# Patient Record
Sex: Male | Born: 1950 | Race: White | Hispanic: No | Marital: Married | Smoking: Never smoker
Health system: Southern US, Community
[De-identification: ages and names within clinical notes are randomized; demographics above are authoritative.]

## PROBLEM LIST (undated history)

## (undated) DIAGNOSIS — K219 Gastro-esophageal reflux disease without esophagitis: Secondary | ICD-10-CM

## (undated) HISTORY — DX: Gastro-esophageal reflux disease without esophagitis: K21.9

## (undated) HISTORY — PX: UPPER GASTROINTESTINAL ENDOSCOPY: SHX188

## (undated) HISTORY — PX: COLONOSCOPY: SHX174

---

## 2001-11-11 ENCOUNTER — Ambulatory Visit (HOSPITAL_COMMUNITY): Admission: RE | Admit: 2001-11-11 | Discharge: 2001-11-11 | Payer: Self-pay | Admitting: Internal Medicine

## 2001-11-11 ENCOUNTER — Encounter: Payer: Self-pay | Admitting: Internal Medicine

## 2009-08-09 ENCOUNTER — Encounter: Admission: RE | Admit: 2009-08-09 | Discharge: 2009-08-09 | Payer: Self-pay | Admitting: Internal Medicine

## 2009-09-23 ENCOUNTER — Encounter: Admission: RE | Admit: 2009-09-23 | Discharge: 2009-09-23 | Payer: Self-pay | Admitting: Internal Medicine

## 2012-10-05 ENCOUNTER — Ambulatory Visit (INDEPENDENT_AMBULATORY_CARE_PROVIDER_SITE_OTHER): Payer: BC Managed Care – PPO | Admitting: Internal Medicine

## 2012-10-05 ENCOUNTER — Encounter: Payer: Self-pay | Admitting: Internal Medicine

## 2012-10-05 VITALS — BP 134/68 | HR 53 | Temp 98.0°F | Resp 16 | Ht 70.0 in | Wt 167.6 lb

## 2012-10-05 DIAGNOSIS — Z0000 Encounter for general adult medical examination without abnormal findings: Secondary | ICD-10-CM

## 2012-10-05 LAB — CBC WITH DIFFERENTIAL/PLATELET
Basophils Absolute: 0.1 10*3/uL (ref 0.0–0.1)
Basophils Relative: 1 % (ref 0–1)
Eosinophils Absolute: 0.2 10*3/uL (ref 0.0–0.7)
Eosinophils Relative: 2 % (ref 0–5)
HCT: 45.6 % (ref 39.0–52.0)
Hemoglobin: 15.3 g/dL (ref 13.0–17.0)
Lymphocytes Relative: 23 % (ref 12–46)
Lymphs Abs: 2 10*3/uL (ref 0.7–4.0)
MCH: 28.7 pg (ref 26.0–34.0)
MCHC: 33.6 g/dL (ref 30.0–36.0)
MCV: 85.6 fL (ref 78.0–100.0)
Monocytes Absolute: 0.5 10*3/uL (ref 0.1–1.0)
Monocytes Relative: 5 % (ref 3–12)
Neutro Abs: 6.1 10*3/uL (ref 1.7–7.7)
Neutrophils Relative %: 69 % (ref 43–77)
Platelets: 375 10*3/uL (ref 150–400)
RBC: 5.33 MIL/uL (ref 4.22–5.81)
RDW: 13.9 % (ref 11.5–15.5)
WBC: 8.9 10*3/uL (ref 4.0–10.5)

## 2012-10-05 LAB — POCT URINALYSIS DIPSTICK
Glucose, UA: NEGATIVE
Ketones, UA: NEGATIVE
Leukocytes, UA: NEGATIVE
Nitrite, UA: NEGATIVE
Protein, UA: 30
Spec Grav, UA: >=1.03
Urobilinogen, UA: 0.2
pH, UA: 5

## 2012-10-05 LAB — POCT UA - MICROSCOPIC ONLY
Bacteria, U Microscopic: NEGATIVE
Casts, Ur, LPF, POC: NEGATIVE
Crystals, Ur, HPF, POC: NEGATIVE
Mucus, UA: NEGATIVE
RBC, urine, microscopic: NEGATIVE
Yeast, UA: NEGATIVE

## 2012-10-05 LAB — IFOBT (OCCULT BLOOD): IFOBT: NEGATIVE

## 2012-10-05 NOTE — Progress Notes (Signed)
  Subjective:    Patient ID: Henry Tate, male    DOB: 1951-02-21, 62 y.o.   MRN: [id number]  HPI CPE Continues to do extremely well with no illnesses or medications Headache syndrome of unknown etiology associated with sneezing has gradually improved and is no longer interfering with activity Exercises every day from 5 AM to 6/then meditation and other stretching before work at [employer] Recent reading about codependency-relationship problems unchanged  Family history/social history-unchanged  Review of Systems  Constitutional: Negative for fever, activity change, appetite change, fatigue and unexpected weight change.  HENT: Negative for hearing loss, sneezing, trouble swallowing, neck pain and postnasal drip.   Eyes: Negative for photophobia, discharge and visual disturbance.  Respiratory: Negative for cough, chest tightness and shortness of breath.   Cardiovascular: Negative for chest pain, palpitations and leg swelling.  Gastrointestinal: Negative for abdominal pain, diarrhea, constipation and blood in stool.  Endocrine: Negative for cold intolerance and polyuria.  Genitourinary: Negative for dysuria, urgency, frequency and testicular pain.  Musculoskeletal: Negative for myalgias, back pain, joint swelling and gait problem.       Tendinitis of the wrists and elbows now well controlled by his exercise regimen  Skin: Negative for rash.  Neurological: Negative for weakness and light-headedness.  Hematological: Negative for adenopathy. Does not bruise/bleed easily.  Psychiatric/Behavioral: Negative for sleep disturbance, dysphoric mood and decreased concentration.       Objective:   Physical Exam  Constitutional: He is oriented to person, place, and time. He appears well-developed and well-nourished.  HENT:  Right Ear: External ear normal.  Left Ear: External ear normal.  Nose: Nose normal.  Mouth/Throat: Oropharynx is clear and moist.  Eyes: Conjunctivae  and EOM are normal. Pupils are equal, round, and reactive to light.  Neck: Normal range of motion. Neck supple. No thyromegaly present.  Cardiovascular: Normal rate, regular rhythm, normal heart sounds and intact distal pulses.  Exam reveals no gallop and no friction rub.   No murmur heard. Pulmonary/Chest: Effort normal and breath sounds normal. He has no wheezes.  Abdominal: Soft. Bowel sounds are normal. He exhibits no distension and no mass. There is no tenderness. There is no rebound.  Genitourinary: Rectum normal and prostate normal. Guaiac negative stool.  Musculoskeletal: Normal range of motion. He exhibits no edema.  Lymphadenopathy:    He has no cervical adenopathy.  Neurological: He is alert and oriented to person, place, and time. He has normal reflexes. He displays normal reflexes. No cranial nerve deficit.  Skin: No rash noted.  Psychiatric: He has a normal mood and affect. His behavior is normal. Judgment and thought content normal.          Assessment & Plan:  Annual physical examination  Healthy Routine labs including hepatitis C screening as directed by the [facility]

## 2012-10-06 LAB — COMPREHENSIVE METABOLIC PANEL
ALT: 18 U/L (ref 0–53)
AST: 14 U/L (ref 0–37)
Albumin: 4.5 g/dL (ref 3.5–5.2)
Alkaline Phosphatase: 73 U/L (ref 39–117)
BUN: 22 mg/dL (ref 6–23)
CO2: 28 mEq/L (ref 19–32)
Calcium: 9.4 mg/dL (ref 8.4–10.5)
Chloride: 102 mEq/L (ref 96–112)
Creat: 1.22 mg/dL (ref 0.50–1.35)
Glucose, Bld: 98 mg/dL (ref 70–99)
Potassium: 4.2 mEq/L (ref 3.5–5.3)
Sodium: 138 mEq/L (ref 135–145)
Total Bilirubin: 0.9 mg/dL (ref 0.3–1.2)
Total Protein: 7.3 g/dL (ref 6.0–8.3)

## 2012-10-06 LAB — LIPID PANEL
Cholesterol: 242 mg/dL — ABNORMAL HIGH (ref 0–200)
HDL: 54 mg/dL (ref >39)
LDL Cholesterol: 173 mg/dL — ABNORMAL HIGH (ref 0–99)
Total CHOL/HDL Ratio: 4.5 Ratio
Triglycerides: 75 mg/dL (ref <150)
VLDL: 15 mg/dL (ref 0–40)

## 2012-10-06 LAB — HEPATITIS C ANTIBODY: HCV Ab: NEGATIVE

## 2012-10-06 LAB — PSA: PSA: 0.61 ng/mL (ref <=4.00)

## 2012-10-07 ENCOUNTER — Encounter: Payer: Self-pay | Admitting: Internal Medicine

## 2013-06-02 ENCOUNTER — Telehealth: Payer: Self-pay | Admitting: Radiology

## 2013-06-02 NOTE — Telephone Encounter (Signed)
He has had Tdap, wife advised.

## 2014-04-02 ENCOUNTER — Encounter: Payer: Self-pay | Admitting: Internal Medicine

## 2014-05-25 ENCOUNTER — Ambulatory Visit (AMBULATORY_SURGERY_CENTER): Payer: Self-pay | Admitting: *Deleted

## 2014-05-25 VITALS — Ht 70.5 in | Wt 171.0 lb

## 2014-05-25 DIAGNOSIS — Z1211 Encounter for screening for malignant neoplasm of colon: Secondary | ICD-10-CM

## 2014-05-25 MED ORDER — MOVIPREP 100 G PO SOLR: 1.0000 | Freq: Once | ORAL | Status: DC

## 2014-05-25 NOTE — Progress Notes (Signed)
No egg or soy allergy. No anesthesia problems.  No diet meds  No home O2 

## 2014-06-08 ENCOUNTER — Ambulatory Visit (AMBULATORY_SURGERY_CENTER): Payer: BC Managed Care – PPO | Admitting: Internal Medicine

## 2014-06-08 ENCOUNTER — Encounter: Payer: Self-pay | Admitting: Internal Medicine

## 2014-06-08 VITALS — BP 130/69 | HR 57 | Temp 95.9°F | Resp 27 | Ht 70.5 in | Wt 171.0 lb

## 2014-06-08 DIAGNOSIS — Z1211 Encounter for screening for malignant neoplasm of colon: Secondary | ICD-10-CM

## 2014-06-08 MED ORDER — SODIUM CHLORIDE 0.9 % IV SOLN: 500.0000 mL | INTRAVENOUS | Status: DC

## 2014-06-08 NOTE — Progress Notes (Signed)
No problems noted in the recovery room. maw 

## 2014-06-08 NOTE — Progress Notes (Signed)
Report to PACU, RN, vss, BBS= Clear.  

## 2014-06-08 NOTE — Patient Instructions (Signed)
YOU HAD AN ENDOSCOPIC PROCEDURE TODAY AT THE [facility]: Refer to the procedure report that was given to you for any specific questions about what was found during the examination.  If the procedure report does not answer your questions, please call your gastroenterologist to clarify.  If you requested that your care partner not be given the details of your procedure findings, then the procedure report has been included in a sealed envelope for you to review at your convenience later.  YOU SHOULD EXPECT: Some feelings of bloating in the abdomen. Passage of more gas than usual.  Walking can help get rid of the air that was put into your GI tract during the procedure and reduce the bloating. If you had a lower endoscopy (such as a colonoscopy or flexible sigmoidoscopy) you may notice spotting of blood in your stool or on the toilet paper. If you underwent a bowel prep for your procedure, then you may not have a normal bowel movement for a few days.  DIET: Your first meal following the procedure should be a light meal and then it is ok to progress to your normal diet.  A half-sandwich or bowl of soup is an example of a good first meal.  Heavy or fried foods are harder to digest and may make you feel nauseous or bloated.  Likewise meals heavy in dairy and vegetables can cause extra gas to form and this can also increase the bloating.  Drink plenty of fluids but you should avoid alcoholic beverages for 24 hours.  ACTIVITY: Your care partner should take you home directly after the procedure.  You should plan to take it easy, moving slowly for the rest of the day.  You can resume normal activity the day after the procedure however you should NOT DRIVE or use heavy machinery for 24 hours (because of the sedation medicines used during the test).    SYMPTOMS TO REPORT IMMEDIATELY: A gastroenterologist can be reached at any hour.  During normal business hours, 8:30 AM to 5:00 PM Monday through Friday,  call [phone].  After hours and on weekends, please call the GI answering service at [phone] who will take a message and have the physician on call contact you.   Following lower endoscopy (colonoscopy or flexible sigmoidoscopy):  Excessive amounts of blood in the stool  Significant tenderness or worsening of abdominal pains  Swelling of the abdomen that is new, acute  Fever of 100F or higher  FOLLOW UP: If any biopsies were taken you will be contacted by phone or by letter within the next 1-3 weeks.  Call your gastroenterologist if you have not heard about the biopsies in 3 weeks.  Our staff will call the home number listed on your records the next business day following your procedure to check on you and address any questions or concerns that you may have at that time regarding the information given to you following your procedure. This is a courtesy call and so if there is no answer at the home number and we have not heard from you through the emergency physician on call, we will assume that you have returned to your regular daily activities without incident.  SIGNATURES/CONFIDENTIALITY: You and/or your care partner have signed paperwork which will be entered into your electronic medical record.  These signatures attest to the fact that that the information above on your After Visit Summary has been reviewed and is understood.  Full responsibility of the confidentiality of this   discharge information lies with you and/or your care-partner.     Handouts were given to your care partner on diverticulosis and a high fiber diet with liberal fluid intake. You may resume your current medications today. Please call if any questions or concerns.   

## 2014-06-11 ENCOUNTER — Telehealth: Payer: Self-pay | Admitting: *Deleted

## 2014-06-11 NOTE — Telephone Encounter (Signed)
  Follow up Call-  Call back number 06/08/2014  Post procedure Call Back phone  # [phone]  Permission to leave phone message Yes     Patient questions:  Do you have a fever, pain , or abdominal swelling? No. Pain Score  0 *  Have you tolerated food without any problems? Yes.    Have you been able to return to your normal activities? Yes.    Do you have any questions about your discharge instructions: Diet   No. Medications  No. Follow up visit  No.  Do you have questions or concerns about your Care? No.  Actions: * If pain score is 4 or above: No action needed, pain <4.

## 2014-06-14 NOTE — Op Note (Signed)
[facility]  [address] [place], [id number]   COLONOSCOPY PROCEDURE REPORT  PATIENT: Henry Tate, Henry Tate  MR#: [id number] BIRTHDATE: [birth year] , 63  yrs. old GENDER: male ENDOSCOPIST: Eustace Quail, MD REFERRED GD:[id number] Recall, PROCEDURE DATE:  06/11/2014 PROCEDURE:   Colonoscopy, screening First Screening Colonoscopy - Avg.  risk and is 50 yrs.  old or older - No.  Prior Negative Screening - Now for repeat screening. 10 or more years since last screening  History of Adenoma - Now for follow-up colonoscopy & has been > or = to 3 yrs.  N/A  Polyps Removed Today? No.  Polyps Removed Today? No.  Recommend repeat exam, <10 yrs? No. ASA CLASS:   Class II INDICATIONS:average risk for colorectal cancer.. Negative index exam June 2003 MEDICATIONS: Monitored anesthesia care and Propofol 220 mg IV  DESCRIPTION OF PROCEDURE:   After the risks benefits and alternatives of the procedure were thoroughly explained, informed consent was obtained.  The digital rectal exam revealed no abnormalities of the rectum.   The LB [id number]  endoscope was introduced through the anus and advanced to the cecum, which was identified by both the appendix and ileocecal valve. No adverse events experienced.   The quality of the prep was excellent, using MoviPrep  The instrument was then slowly withdrawn as the colon was fully examined.   NOTE:The endoscopy reporting system was nonfunctional on the day of the examination. No ability for photodocumentation. Report created after the system issues resolved.        COLON FINDINGS: There was moderate diverticulosis noted in the sigmoid colon.   The examination was otherwise normal.  Retroflexed views revealed no abnormalities. The time to cecum=2 minutes 56 seconds.  Withdrawal time=12 minutes 38 seconds.  The scope was withdrawn and the procedure completed. COMPLICATIONS: There were no immediate complications.  ENDOSCOPIC  IMPRESSION: 1.   Moderate diverticulosis was noted in the sigmoid colon 2.   The examination was otherwise normal  RECOMMENDATIONS: 1. Continue current colorectal screening recommendations for "routine risk" patients with a repeat colonoscopy in 10 years.  eSigned:  Eustace Quail, MD 06/14/2014 12:38 PM   cc: The Patient and Tami Lin, MD

## 2015-03-07 ENCOUNTER — Ambulatory Visit (INDEPENDENT_AMBULATORY_CARE_PROVIDER_SITE_OTHER): Payer: BLUE CROSS/BLUE SHIELD | Admitting: Emergency Medicine

## 2015-03-07 VITALS — BP 114/68 | HR 60 | Temp 98.3°F | Resp 20 | Ht 70.0 in | Wt 168.6 lb

## 2015-03-07 DIAGNOSIS — Z23 Encounter for immunization: Secondary | ICD-10-CM | POA: Diagnosis not present

## 2015-03-07 DIAGNOSIS — M79644 Pain in right finger(s): Secondary | ICD-10-CM | POA: Diagnosis not present

## 2015-03-07 DIAGNOSIS — S61219A Laceration without foreign body of unspecified finger without damage to nail, initial encounter: Secondary | ICD-10-CM

## 2015-03-07 DIAGNOSIS — S61210A Laceration without foreign body of right index finger without damage to nail, initial encounter: Secondary | ICD-10-CM

## 2015-03-07 NOTE — Patient Instructions (Signed)
WOUND CARE Please return in 7-10 days to have your stitches/staples removed or sooner if you have concerns. . Keep area clean and dry with dressing in place for 24 hours. . After 24 hours, remove bandage and wash wound gently with mild soap and warm water. When skin is dry, reapply a new bandage. Once wound is no longer draining, may leave wound open to air. . Continue daily cleansing with soap and water until stitches/staples are removed. . Do not apply any ointments or creams to the wound while stitches/staples are in place, as this may cause delayed healing. . Notify the office if you experience any of the following signs of infection: Swelling, redness, pus drainage, streaking, fever >101.0 F . Notify the office if you experience excessive bleeding that does not stop after 15-20 minutes of constant, firm pressure.   

## 2015-03-07 NOTE — Progress Notes (Signed)
Procedure: Verbal consent obtained. Skin was anesthetized with 4 cc 1% lido without epi by metacarpal block and cleaned with soap and water. Laceration was sutured with #5 simple 5.0 ethilon sutures. Wound was dressed and wound care discussed.

## 2015-03-07 NOTE — Progress Notes (Signed)
Subjective:  Patient ID: Henry Tate, male    DOB: 1951-02-23  Age: 64 y.o. MRN: [id number]  CC: Laceration   HPI YECHESKEL KUREK presents  with a laceration of the right second finger while installing a dishwasher. He is not current on tetanus. He has pain in the finger but denies any other complaints had no improvement with over the counter medication. He has injured himself this morning  History Ihan has no past medical history on file.   He has past surgical history that includes Colonoscopy and Upper gastrointestinal endoscopy.   His  family history includes Depression in his mother. There is no history of Colon cancer.  He   reports that he has never smoked. He has never used smokeless tobacco. He reports that he drinks alcohol. He reports that he does not use illicit drugs.  No outpatient prescriptions prior to visit.   No facility-administered medications prior to visit.    Social History   Social History  . Marital Status: Single    Spouse Name: N/A  . Number of Children: N/A  . Years of Education: N/A   Occupational History  . Gen. Contractor    Social History Main Topics  . Smoking status: Never Smoker   . Smokeless tobacco: Never Used  . Alcohol Use: 0.0 oz/week    0 Standard drinks or equivalent per week     Comment: rarely  . Drug Use: No  . Sexual Activity:    Partners: Female   Other Topics Concern  . None   Social History Narrative   Married. Education: [employer]. Exercise: Daily for 45 minutes Cardio and weights.     Review of Systems  Constitutional: Negative for fever, chills and appetite change.  HENT: Negative for congestion, ear pain, postnasal drip, sinus pressure and sore throat.   Eyes: Negative for pain and redness.  Respiratory: Negative for cough, shortness of breath and wheezing.   Cardiovascular: Negative for leg swelling.  Gastrointestinal: Negative for nausea, vomiting, abdominal pain, diarrhea, constipation and  blood in stool.  Endocrine: Negative for polyuria.  Genitourinary: Negative for dysuria, urgency, frequency and flank pain.  Musculoskeletal: Negative for gait problem.  Skin: Negative for rash.  Neurological: Negative for weakness and headaches.  Psychiatric/Behavioral: Negative for confusion and decreased concentration. The patient is not nervous/anxious.     Objective:  BP 114/68 mmHg  Pulse 60  Temp(Src) 98.3 F (36.8 C) (Oral)  Resp 20  Ht 5\' 10"  (1.778 m)  Wt 168 lb 9.6 oz (76.476 kg)  BMI 24.19 kg/m2  SpO2 98%  Physical Exam  Constitutional: He is oriented to person, place, and time. He appears well-developed and well-nourished.  HENT:  Head: Normocephalic and atraumatic.  Eyes: Conjunctivae are normal. Pupils are equal, round, and reactive to light.  Pulmonary/Chest: Effort normal.  Musculoskeletal: He exhibits no edema.  Neurological: He is alert and oriented to person, place, and time.  Skin: Skin is dry.  Psychiatric: He has a normal mood and affect. His behavior is normal. Thought content normal.   Is a 1.5 cm 3 corner laceration of the flexor surface of the terminal phalanx of the right second finger no neurovascular tendon injury was identified is no foreign body   Assessment & Plan:   Kristof was seen today for laceration.  Diagnoses and all orders for this visit:  Need for Tdap vaccination -     Tdap vaccine greater than or equal to 7yo IM   Mr. Grandville Silos  does not currently have medications on file.  No orders of the defined types were placed in this encounter.    Appropriate red flag conditions were discussed with the patient as well as actions that should be taken.  Patient expressed his understanding.  Follow-up: Return in about 1 day (around 03/08/2015).  Roselee Culver, MD

## 2015-06-03 ENCOUNTER — Encounter: Payer: Self-pay | Admitting: Internal Medicine

## 2015-07-08 ENCOUNTER — Ambulatory Visit (INDEPENDENT_AMBULATORY_CARE_PROVIDER_SITE_OTHER): Payer: BLUE CROSS/BLUE SHIELD | Admitting: Emergency Medicine

## 2015-07-08 VITALS — BP 118/80 | HR 89 | Temp 98.9°F | Resp 18 | Ht 70.0 in | Wt 170.6 lb

## 2015-07-08 DIAGNOSIS — J0140 Acute pansinusitis, unspecified: Secondary | ICD-10-CM | POA: Diagnosis not present

## 2015-07-08 DIAGNOSIS — J209 Acute bronchitis, unspecified: Secondary | ICD-10-CM

## 2015-07-08 MED ORDER — HYDROCOD POLST-CPM POLST ER 10-8 MG/5ML PO SUER: 5.0000 mL | Freq: Two times a day (BID) | ORAL | Status: DC

## 2015-07-08 MED ORDER — PSEUDOEPHEDRINE-GUAIFENESIN ER 60-600 MG PO TB12
1.0000 | ORAL_TABLET | Freq: Two times a day (BID) | ORAL | Status: DC
Start: 2015-07-08 — End: 2015-07-31

## 2015-07-08 MED ORDER — AMOXICILLIN-POT CLAVULANATE 875-125 MG PO TABS: 1.0000 | ORAL_TABLET | Freq: Two times a day (BID) | ORAL | Status: DC

## 2015-07-08 NOTE — Patient Instructions (Signed)

## 2015-07-08 NOTE — Progress Notes (Signed)
Subjective:  Patient ID: Henry Tate, male    DOB: April 17, 1951  Age: 65 y.o. MRN: [id number]  CC: URI   HPI KHIAN DEBO presents   Patient has nasal congestion postnasal rainage with a purulent nasal discharge. Shortness cheeks. He has a cough productive of purulent sputum. Associated with wheezing and shortness of breath with exertion. He has no nausea vomiting or stool change. No rash. No improvement with over-the-counter medication.  History Alston has no past medical history on file.   He has past surgical history that includes Colonoscopy and Upper gastrointestinal endoscopy.   His  family history includes Depression in his mother. There is no history of Colon cancer.  He   reports that he has never smoked. He has never used smokeless tobacco. He reports that he drinks alcohol. He reports that he does not use illicit drugs.  No outpatient prescriptions prior to visit.   No facility-administered medications prior to visit.    Social History   Social History  . Marital Status: Single    Spouse Name: N/A  . Number of Children: N/A  . Years of Education: N/A   Occupational History  . Gen. Contractor    Social History Main Topics  . Smoking status: Never Smoker   . Smokeless tobacco: Never Used  . Alcohol Use: 0.0 oz/week    0 Standard drinks or equivalent per week     Comment: rarely  . Drug Use: No  . Sexual Activity:    Partners: Female   Other Topics Concern  . None   Social History Narrative   Married. Education: [employer]. Exercise: Daily for 45 minutes Cardio and weights.     Review of Systems  Constitutional: Negative for fever, chills and appetite change.  HENT: Positive for congestion, postnasal drip, rhinorrhea and sinus pressure. Negative for ear pain and sore throat.   Eyes: Negative for pain and redness.  Respiratory: Positive for cough, shortness of breath and wheezing.   Cardiovascular: Negative for leg swelling.    Gastrointestinal: Negative for nausea, vomiting, abdominal pain, diarrhea, constipation and blood in stool.  Endocrine: Negative for polyuria.  Genitourinary: Negative for dysuria, urgency, frequency and flank pain.  Musculoskeletal: Negative for gait problem.  Skin: Negative for rash.  Neurological: Negative for weakness and headaches.  Psychiatric/Behavioral: Negative for confusion and decreased concentration. The patient is not nervous/anxious.     Objective:  BP 118/80 mmHg  Pulse 89  Temp(Src) 98.9 F (37.2 C) (Oral)  Resp 18  Ht 5\' 10"  (1.778 m)  Wt 170 lb 9.6 oz (77.384 kg)  BMI 24.48 kg/m2  SpO2 97%  Physical Exam  Constitutional: He is oriented to person, place, and time. He appears well-developed and well-nourished. No distress.  HENT:  Head: Normocephalic and atraumatic.  Right Ear: External ear normal.  Left Ear: External ear normal.  Nose: Nose normal.  Eyes: Conjunctivae and EOM are normal. Pupils are equal, round, and reactive to light. No scleral icterus.  Neck: Normal range of motion. Neck supple. No tracheal deviation present.  Cardiovascular: Normal rate, regular rhythm and normal heart sounds.   Pulmonary/Chest: Effort normal. No respiratory distress. He has no wheezes. He has no rales.  Abdominal: He exhibits no mass. There is no tenderness. There is no rebound and no guarding.  Musculoskeletal: He exhibits no edema.  Lymphadenopathy:    He has no cervical adenopathy.  Neurological: He is alert and oriented to person, place, and time. Coordination normal.  Skin: Skin  is warm and dry. No rash noted.  Psychiatric: He has a normal mood and affect. His behavior is normal.      Assessment & Plan:   Tyreq was seen today for uri.  Diagnoses and all orders for this visit:  Acute bronchitis, unspecified organism  Acute pansinusitis, recurrence not specified  Other orders -     amoxicillin-clavulanate (AUGMENTIN) 875-125 MG tablet; Take 1 tablet by  mouth 2 (two) times daily. -     pseudoephedrine-guaifenesin (MUCINEX D) 60-600 MG 12 hr tablet; Take 1 tablet by mouth every 12 (twelve) hours. -     chlorpheniramine-HYDROcodone (TUSSIONEX PENNKINETIC ER) 10-8 MG/5ML SUER; Take 5 mLs by mouth 2 (two) times daily.  I am having Mr. Czerwinski start on amoxicillin-clavulanate, pseudoephedrine-guaifenesin, and chlorpheniramine-HYDROcodone.  Meds ordered this encounter  Medications  . amoxicillin-clavulanate (AUGMENTIN) 875-125 MG tablet    Sig: Take 1 tablet by mouth 2 (two) times daily.    Dispense:  20 tablet    Refill:  0  . pseudoephedrine-guaifenesin (MUCINEX D) 60-600 MG 12 hr tablet    Sig: Take 1 tablet by mouth every 12 (twelve) hours.    Dispense:  18 tablet    Refill:  0  . chlorpheniramine-HYDROcodone (TUSSIONEX PENNKINETIC ER) 10-8 MG/5ML SUER    Sig: Take 5 mLs by mouth 2 (two) times daily.    Dispense:  60 mL    Refill:  0    Appropriate red flag conditions were discussed with the patient as well as actions that should be taken.  Patient expressed his understanding.  Follow-up: Return if symptoms worsen or fail to improve.  Roselee Culver, MD

## 2015-07-17 ENCOUNTER — Ambulatory Visit (INDEPENDENT_AMBULATORY_CARE_PROVIDER_SITE_OTHER): Payer: BLUE CROSS/BLUE SHIELD | Admitting: Internal Medicine

## 2015-07-17 ENCOUNTER — Encounter: Payer: Self-pay | Admitting: Internal Medicine

## 2015-07-17 VITALS — BP 158/75 | HR 51 | Temp 97.9°F | Resp 16 | Ht 70.5 in | Wt 167.0 lb

## 2015-07-17 DIAGNOSIS — Z23 Encounter for immunization: Secondary | ICD-10-CM

## 2015-07-17 DIAGNOSIS — E7800 Pure hypercholesterolemia, unspecified: Secondary | ICD-10-CM

## 2015-07-17 DIAGNOSIS — Z0000 Encounter for general adult medical examination without abnormal findings: Secondary | ICD-10-CM | POA: Diagnosis not present

## 2015-07-17 MED ORDER — ZOSTER VACCINE LIVE 19400 UNT/0.65ML ~~LOC~~ SOLR: 0.6500 mL | Freq: Once | SUBCUTANEOUS | Status: DC

## 2015-07-17 NOTE — Progress Notes (Signed)
   Subjective:    Patient ID: Henry Tate, male    DOB: 09-Dec-1950, 65 y.o.   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 He appears well-developed and well-nourished.  HENT:  Head: Normocephalic and atraumatic.  Right Ear: Hearing, tympanic membrane, external ear and ear canal normal.  Left Ear: Hearing, tympanic membrane, external ear and ear canal normal.  Nose: Nose normal.  Mouth/Throat: Uvula is midline, oropharynx is clear and moist and mucous membranes are normal.  Eyes: Conjunctivae, EOM and lids are normal. Pupils are equal, round, and reactive to light. Right eye exhibits no discharge. Left eye exhibits no discharge. No scleral icterus.  Neck: Trachea normal and normal range of motion. Neck supple. Carotid bruit is not present.  Cardiovascular: Normal rate, regular rhythm, normal heart sounds, intact distal pulses and normal pulses.   No murmur heard. Pulmonary/Chest: Effort normal and breath sounds normal. No respiratory distress. He has no wheezes. He has no rhonchi. He has no rales.  Abdominal: Soft. Normal appearance and bowel sounds are normal. He exhibits no abdominal bruit. There is no tenderness.  Musculoskeletal: Normal range of motion. He exhibits no edema or tenderness.  Lymphadenopathy:       Head (right side): No submental, no submandibular, no tonsillar, no preauricular, no posterior auricular and no occipital adenopathy present.       Head (left side): No submental, no submandibular, no  tonsillar, no preauricular, no posterior auricular and no occipital adenopathy present.    He has no cervical adenopathy.  Neurological: He is alert and oriented to person, place, and time. He has normal strength and normal reflexes. No cranial nerve deficit or sensory deficit. Coordination and gait normal.  Skin: Skin is warm, dry and intact. No lesion and no rash noted.  L shin with 2 1cm oval lesions with erythem surfaces and hypervascularity at edges but no unusual pigmentation ot asymmetrical features  Psychiatric: He has a normal mood and affect. His speech is normal and behavior is normal. Judgment and thought content normal.  BP 158/75 mmHg  Pulse 51  Temp(Src) 97.9 F (36.6 C)  Resp 16  Ht 5' 10.5" (1.791 m)  Wt 167 lb (75.751 kg)  BMI 23.62 kg/m2  No htn in past--he's on decongestants//118/80 at last ov        Assessment & Plan:  Annual physical exam  Need for shingles vaccine - Plan: zoster vaccine live, PF, (ZOSTAVAX) 29562 UNT/0.65ML injection  Elevated cholesterol---recheck--no risk factors other than age and he's against meds for primary prevention  Elevated BP--he'll do outside BPs  Skin lesions-if not resolved w/ topicals 2-3 w we will schedule bx by derm  routin labs-notify results

## 2015-07-31 ENCOUNTER — Other Ambulatory Visit: Payer: Self-pay | Admitting: Family Medicine

## 2015-07-31 ENCOUNTER — Ambulatory Visit (INDEPENDENT_AMBULATORY_CARE_PROVIDER_SITE_OTHER): Payer: BLUE CROSS/BLUE SHIELD | Admitting: Family Medicine

## 2015-07-31 ENCOUNTER — Ambulatory Visit (INDEPENDENT_AMBULATORY_CARE_PROVIDER_SITE_OTHER): Payer: BLUE CROSS/BLUE SHIELD

## 2015-07-31 ENCOUNTER — Ambulatory Visit
Admission: RE | Admit: 2015-07-31 | Discharge: 2015-07-31 | Disposition: A | Discharge status: Home / Self Care | Payer: BLUE CROSS/BLUE SHIELD | Source: Ambulatory Visit | Attending: Family Medicine | Admitting: Family Medicine

## 2015-07-31 VITALS — BP 140/70 | HR 66 | Temp 97.8°F | Resp 20 | Ht 71.0 in | Wt 171.4 lb

## 2015-07-31 DIAGNOSIS — D72829 Elevated white blood cell count, unspecified: Secondary | ICD-10-CM

## 2015-07-31 DIAGNOSIS — N200 Calculus of kidney: Secondary | ICD-10-CM

## 2015-07-31 DIAGNOSIS — R319 Hematuria, unspecified: Secondary | ICD-10-CM

## 2015-07-31 DIAGNOSIS — R1030 Lower abdominal pain, unspecified: Secondary | ICD-10-CM

## 2015-07-31 LAB — POCT CBC
Granulocyte percent: 87 %G — AB (ref 37–80)
HCT, POC: 44.4 % (ref 43.5–53.7)
Hemoglobin: 14.9 g/dL (ref 14.1–18.1)
Lymph, poc: 1.2 (ref 0.6–3.4)
MCH, POC: 29.3 pg (ref 27–31.2)
MCHC: 33.6 g/dL (ref 31.8–35.4)
MCV: 87 fL (ref 80–97)
MID (cbc): 0.7 (ref 0–0.9)
MPV: 7.2 fL (ref 0–99.8)
POC Granulocyte: 12.4 — AB (ref 2–6.9)
POC LYMPH PERCENT: 8.2 %L — AB (ref 10–50)
POC MID %: 4.8 %M (ref 0–12)
Platelet Count, POC: 326 10*3/uL (ref 142–424)
RBC: 5.1 M/uL (ref 4.69–6.13)
RDW, POC: 14.3 %
WBC: 14.3 10*3/uL — AB (ref 4.6–10.2)

## 2015-07-31 LAB — POCT URINALYSIS DIP (MANUAL ENTRY)
Bilirubin, UA: NEGATIVE
Glucose, UA: NEGATIVE
Ketones, POC UA: NEGATIVE
Nitrite, UA: NEGATIVE
Protein Ur, POC: 30 — AB
Spec Grav, UA: 1.025
Urobilinogen, UA: 0.2
pH, UA: 5

## 2015-07-31 LAB — POC MICROSCOPIC URINALYSIS (UMFC): Mucus: ABSENT

## 2015-07-31 MED ORDER — OXYCODONE-ACETAMINOPHEN 5-325 MG PO TABS: 1.0000 | ORAL_TABLET | Freq: Three times a day (TID) | ORAL | Status: DC | PRN

## 2015-07-31 MED ORDER — TAMSULOSIN HCL 0.4 MG PO CAPS: 0.4000 mg | ORAL_CAPSULE | Freq: Every day | ORAL | Status: DC

## 2015-07-31 MED ORDER — POLYETHYLENE GLYCOL 3350 17 GM/SCOOP PO POWD: 17.0000 g | Freq: Two times a day (BID) | ORAL | Status: DC | PRN

## 2015-07-31 NOTE — Progress Notes (Signed)
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  He has been quite constipated lately.  He denies dysuria or hematuria. He's had no fever. He has a family history of diverticulitis but he's never had this himself.  Objective:BP 140/70 mmHg  Pulse 66  Temp(Src) 97.8 F (36.6 C) (Oral)  Resp 20  Ht 5\' 11"  (1.803 m)  Wt 171 lb 6.4 oz (77.747 kg)  BMI 23.92 kg/m2  SpO2 98% HEENT: Moist mucous membranes, no acute distress Chest: Clear Heart: Regular, 1/6 systolic flow murmur Abdomen: Soft minimally, minimally tender, no masses, no HSM, no rebound or guarding Skin: Dry and warm without rash Extremities: No edema, moving normally  [facility] reading (PRIMARY) by  Dr. Ryan Art:  Heavy stool burden in the colon.  Results for orders placed or performed in visit on 07/31/15  POCT CBC  Result Value Ref Range   WBC 14.3 (A) 4.6 - 10.2 K/uL   Lymph, poc 1.2 0.6 - 3.4   POC LYMPH PERCENT 8.2 (A) 10 - 50 %L   MID (cbc) 0.7 0 - 0.9   POC MID % 4.8 0 - 12 %M   POC Granulocyte 12.4 (A) 2 - 6.9   Granulocyte percent 87.0 (A) 37 - 80 %G   RBC 5.10 4.69 - 6.13 M/uL   Hemoglobin 14.9 14.1 - 18.1 g/dL   HCT, POC 44.4 43.5 - 53.7 %   MCV 87.0 80 - 97 fL   MCH, POC 29.3 27 - 31.2 pg   MCHC 33.6 31.8 - 35.4 g/dL   RDW, POC 14.3 %   Platelet Count, POC 326 142 - 424 K/uL   MPV 7.2 0 - 99.8 fL  POCT urinalysis dipstick  Result Value Ref Range   Color, UA yellow yellow   Clarity, UA clear clear   Glucose, UA negative negative   Bilirubin, UA negative negative   Ketones, POC UA negative negative   Spec Grav, UA 1.025    Blood, UA moderate (A) negative   pH, UA 5.0    Protein Ur, POC =30 (A) negative   Urobilinogen, UA 0.2    Nitrite, UA Negative Negative   Leukocytes, UA Trace (A) Negative  POCT Microscopic Urinalysis ([facility])  Result Value Ref Range   WBC,UR,HPF,POC  None None WBC/hpf   RBC,UR,HPF,POC None None RBC/hpf   Bacteria None None, Too numerous to count   Mucus Absent Absent   Epithelial Cells, UR Per Microscopy Few (A) None, Too numerous to count cells/hpf   Assessment: Acute abdominal pain with hematuria and elevated white count.  Plan: CT with contrast of the abdomen and pelvis today  Signed, Robyn Haber M.D.   Henry Tate, Henry Tate, 65 y.o., Apr 21, 1951 Last Weight:  171 lb 6.4 oz (77.747 kg) Phone:  [phone] PCP:  Leandrew Koyanagi Language:  English Need Interp:  No AllergiesNo Known Allergies Health Maintenance:  Due FYIGeneral Primary Ins:  208 MRN:  [id number] MyChart:  Declined Next Appt:  None   CT Abdomen Pelvis Wo Contrast  Status: Finalresult Visible to patient:  Not Released Dx:  Leukocytosis; Lower abdominal pain; H...        Details     Reading Physician Reading Date Result Priority    Lavonia Dana, MD 07/31/2015          Narrative        CLINICAL DATA: Lower abdominal  pain and microscopic hematuria for 2 days, leukocytosis, question kidney stone  EXAM: CT ABDOMEN AND PELVIS WITHOUT CONTRAST  TECHNIQUE: Multidetector CT imaging of the abdomen and pelvis was performed following the standard protocol without IV contrast. Sagittal and coronal MPR images reconstructed from axial data set. IV contrast was not administered due to renal dysfunction bike testing at time of imaging.  Creatinine was obtained on site at [place] at [address]  Results: Creatinine 2.1 mg/dL.  COMPARISON: None  FINDINGS: Lung bases clear.  Mild LEFT hydronephrosis and hydroureter secondary to a 3 mm calculus at the LEFT ureterovesical junction.  Additional tiny nonobstructing calculi at upper and lower poles LEFT kidney.  Within limits of a nonenhanced exam, no additional focal abnormalities of the liver, spleen, pancreas, kidneys, or adrenal glands  identified.  Multiple tiny ingested radio opacities within stool of the RIGHT colon.  Normal appendix.  Stomach and bowel loops unremarkable for technique.  No mass, adenopathy, free air or free fluid.  Question tiny RIGHT inguinal hernia containing fat.  Degenerative disc disease changes lumbar spine with questionable vertebral hemangioma at L3.  IMPRESSION: Mild LEFT hydronephrosis and hydroureter secondary to a 3 mm LEFT UVJ calculus.  Additional tiny nonobstructing LEFT renal calculi.  Question tiny RIGHT inguinal hernia containing fat.  Renal dysfunction, with creatinine measured at 2.1 mg/dl at time of imaging.   Electronically Signed By: Lavonia Dana M.D. On: 07/31/2015 16:42             Last Resulted: 07/31/15 4:43 PM

## 2015-07-31 NOTE — Patient Instructions (Addendum)
YOU ARE TO GO OVER TO  IMAGING NOW [place].  [id number]  Because you received an x-ray today, you will receive an invoice from [facility]. Please contact [facility] at [phone] with questions or concerns regarding your invoice. Our billing staff will not be able to assist you with those questions.

## 2015-08-08 ENCOUNTER — Encounter: Payer: Self-pay | Admitting: Internal Medicine

## 2015-08-09 ENCOUNTER — Telehealth: Payer: Self-pay | Admitting: Internal Medicine

## 2015-08-09 DIAGNOSIS — Z0000 Encounter for general adult medical examination without abnormal findings: Secondary | ICD-10-CM

## 2015-08-09 NOTE — Telephone Encounter (Signed)
Called and spoke with pt and he stated that he will come next week to the appointment center to have his labs drawn.  Will forward to Dr. Laney Pastor to make him aware so he may enter the future lab orders for this pt.  Thank you.

## 2015-08-10 NOTE — Telephone Encounter (Signed)
Repeat draw as we lost blood at [facility]

## 2015-08-13 ENCOUNTER — Other Ambulatory Visit: Payer: BLUE CROSS/BLUE SHIELD

## 2015-08-13 DIAGNOSIS — Z0000 Encounter for general adult medical examination without abnormal findings: Secondary | ICD-10-CM | POA: Diagnosis not present

## 2015-08-13 LAB — COMPREHENSIVE METABOLIC PANEL
ALK PHOS: 66 U/L (ref 40–115)
ALT: 19 U/L (ref 9–46)
AST: 12 U/L (ref 10–35)
Albumin: 3.7 g/dL (ref 3.6–5.1)
BILIRUBIN TOTAL: 0.5 mg/dL (ref 0.2–1.2)
BUN: 20 mg/dL (ref 7–25)
CO2: 26 mmol/L (ref 20–31)
CREATININE: 1.29 mg/dL — AB (ref 0.70–1.25)
Calcium: 8.8 mg/dL (ref 8.6–10.3)
Chloride: 101 mmol/L (ref 98–110)
GLUCOSE: 103 mg/dL — AB (ref 65–99)
Potassium: 4.4 mmol/L (ref 3.5–5.3)
Sodium: 139 mmol/L (ref 135–146)
TOTAL PROTEIN: 6.7 g/dL (ref 6.1–8.1)

## 2015-08-13 LAB — LIPID PANEL
Cholesterol: 195 mg/dL (ref 125–200)
HDL: 41 mg/dL (ref >=40)
LDL Cholesterol: 137 mg/dL — ABNORMAL HIGH (ref <130)
Total CHOL/HDL Ratio: 4.8 Ratio (ref <=5.0)
Triglycerides: 83 mg/dL (ref <150)
VLDL: 17 mg/dL (ref <30)

## 2015-08-14 ENCOUNTER — Encounter: Payer: Self-pay | Admitting: Internal Medicine

## 2015-08-14 LAB — PSA: PSA: 0.72 ng/mL (ref <=4.00)

## 2016-02-15 ENCOUNTER — Other Ambulatory Visit: Payer: Self-pay | Admitting: Family Medicine

## 2016-02-15 DIAGNOSIS — X503XXA Overexertion from repetitive movements, initial encounter: Secondary | ICD-10-CM

## 2016-02-15 DIAGNOSIS — S161XXA Strain of muscle, fascia and tendon at neck level, initial encounter: Secondary | ICD-10-CM

## 2016-02-18 DIAGNOSIS — M542 Cervicalgia: Secondary | ICD-10-CM | POA: Diagnosis not present

## 2016-02-27 DIAGNOSIS — M542 Cervicalgia: Secondary | ICD-10-CM | POA: Diagnosis not present

## 2016-03-04 DIAGNOSIS — M542 Cervicalgia: Secondary | ICD-10-CM | POA: Diagnosis not present

## 2016-03-06 DIAGNOSIS — M542 Cervicalgia: Secondary | ICD-10-CM | POA: Diagnosis not present

## 2016-03-10 DIAGNOSIS — M542 Cervicalgia: Secondary | ICD-10-CM | POA: Diagnosis not present

## 2016-03-12 DIAGNOSIS — M542 Cervicalgia: Secondary | ICD-10-CM | POA: Diagnosis not present

## 2016-03-16 DIAGNOSIS — M542 Cervicalgia: Secondary | ICD-10-CM | POA: Diagnosis not present

## 2016-03-19 DIAGNOSIS — M542 Cervicalgia: Secondary | ICD-10-CM | POA: Diagnosis not present

## 2016-03-23 DIAGNOSIS — M542 Cervicalgia: Secondary | ICD-10-CM | POA: Diagnosis not present

## 2016-03-26 DIAGNOSIS — M542 Cervicalgia: Secondary | ICD-10-CM | POA: Diagnosis not present

## 2016-03-31 DIAGNOSIS — M542 Cervicalgia: Secondary | ICD-10-CM | POA: Diagnosis not present

## 2016-04-02 DIAGNOSIS — M542 Cervicalgia: Secondary | ICD-10-CM | POA: Diagnosis not present

## 2016-04-06 DIAGNOSIS — M542 Cervicalgia: Secondary | ICD-10-CM | POA: Diagnosis not present

## 2016-04-13 DIAGNOSIS — M542 Cervicalgia: Secondary | ICD-10-CM | POA: Diagnosis not present

## 2016-04-16 DIAGNOSIS — M542 Cervicalgia: Secondary | ICD-10-CM | POA: Diagnosis not present

## 2016-04-21 DIAGNOSIS — M542 Cervicalgia: Secondary | ICD-10-CM | POA: Diagnosis not present

## 2016-04-23 DIAGNOSIS — M542 Cervicalgia: Secondary | ICD-10-CM | POA: Diagnosis not present

## 2016-04-30 DIAGNOSIS — M542 Cervicalgia: Secondary | ICD-10-CM | POA: Diagnosis not present

## 2016-05-04 DIAGNOSIS — M542 Cervicalgia: Secondary | ICD-10-CM | POA: Diagnosis not present

## 2016-05-06 DIAGNOSIS — M542 Cervicalgia: Secondary | ICD-10-CM | POA: Diagnosis not present

## 2016-05-11 DIAGNOSIS — M542 Cervicalgia: Secondary | ICD-10-CM | POA: Diagnosis not present

## 2016-06-15 DIAGNOSIS — H25813 Combined forms of age-related cataract, bilateral: Secondary | ICD-10-CM | POA: Diagnosis not present

## 2016-06-15 DIAGNOSIS — H524 Presbyopia: Secondary | ICD-10-CM | POA: Diagnosis not present

## 2016-06-15 DIAGNOSIS — H02054 Trichiasis without entropian left upper eyelid: Secondary | ICD-10-CM | POA: Diagnosis not present

## 2016-06-15 DIAGNOSIS — D2311 Other benign neoplasm of skin of right eyelid, including canthus: Secondary | ICD-10-CM | POA: Diagnosis not present

## 2016-08-12 DIAGNOSIS — Z1283 Encounter for screening for malignant neoplasm of skin: Secondary | ICD-10-CM | POA: Diagnosis not present

## 2016-08-12 DIAGNOSIS — L308 Other specified dermatitis: Secondary | ICD-10-CM | POA: Diagnosis not present

## 2016-12-02 ENCOUNTER — Ambulatory Visit (HOSPITAL_COMMUNITY)
Admission: EM | Admit: 2016-12-02 | Discharge: 2016-12-02 | Disposition: A | Discharge status: Home / Self Care | Payer: Medicare Other | Attending: Family Medicine | Admitting: Family Medicine

## 2016-12-02 ENCOUNTER — Encounter (HOSPITAL_COMMUNITY): Payer: Self-pay | Admitting: Emergency Medicine

## 2016-12-02 DIAGNOSIS — M722 Plantar fascial fibromatosis: Secondary | ICD-10-CM

## 2016-12-02 MED ORDER — METHYLPREDNISOLONE ACETATE 80 MG/ML IJ SUSP
INTRAMUSCULAR | Status: AC
  Filled 2016-12-02: qty 1

## 2016-12-02 MED ORDER — BUPIVACAINE HCL (PF) 0.5 % IJ SOLN
INTRAMUSCULAR | Status: AC
  Filled 2016-12-02: qty 10

## 2016-12-02 NOTE — ED Provider Notes (Signed)
[facility]    CSN: [id number] Arrival date & time: 12/02/16  1809     History   Chief Complaint Chief Complaint  Patient presents with  . Foot Pain    HPI Henry Tate is a 66 y.o. male.   The patient presented to the [facility] with a complaint of pain to the right foot and heal for about 5 months.      History reviewed. No pertinent past medical history.  There are no active problems to display for this patient.   Past Surgical History:  Procedure Laterality Date  . COLONOSCOPY    . UPPER GASTROINTESTINAL ENDOSCOPY         Home Medications    Prior to Admission medications   Not on File    Family History Family History  Problem Relation Age of Onset  . Depression Mother   . Cancer Mother        breast cancer  . Mental retardation Mother   . Colon cancer Neg Hx   . Pulmonary embolism Father     Social History Social History  Substance Use Topics  . Smoking status: Never Smoker  . Smokeless tobacco: Never Used  . Alcohol use 0.0 oz/week     Comment: rarely     Allergies   Patient has no known allergies.   Review of Systems Review of Systems  Musculoskeletal: Positive for gait problem.  All other systems reviewed and are negative.    Physical Exam Triage Vital Signs ED Triage Vitals  Enc Vitals Group     BP 12/02/16 1847 (!) 146/64     Pulse Rate 12/02/16 1847 (!) 49     Resp 12/02/16 1847 16     Temp 12/02/16 1847 98.2 F (36.8 C)     Temp Source 12/02/16 1847 Oral     SpO2 12/02/16 1847 100 %     Weight --      Height --      Head Circumference --      Peak Flow --      Pain Score 12/02/16 1846 1     Pain Loc --      Pain Edu? --      Excl. in [place]? --    No data found.   Updated Vital Signs BP (!) 146/64 (BP Location: Right Arm)   Pulse (!) 49 Comment: normal per patient  Temp 98.2 F (36.8 C) (Oral)   Resp 16   SpO2 100%    Physical Exam  Constitutional: He appears well-developed and  well-nourished.  HENT:  Right Ear: External ear normal.  Left Ear: External ear normal.  Eyes: Conjunctivae are normal. Pupils are equal, round, and reactive to light.  Neck: Normal range of motion. Neck supple.  Pulmonary/Chest: Effort normal.  Musculoskeletal: Normal range of motion. He exhibits tenderness.  Tender right calcaneus where the insertion of the plantar fascia occurs.  After Betadine prep, the most tender area was injected with 80 mg a depo Medrol and 0.5% Marcaine.  Patient pain was relieved.  Neurological: He is alert.  Skin: Skin is warm and dry.  Nursing note and vitals reviewed.    UC Treatments / Results  Labs (all labs ordered are listed, but only abnormal results are displayed) Labs Reviewed - No data to display  EKG  EKG Interpretation None       Radiology No results found.  Procedures Procedures (including critical care time)  Medications Ordered in UC Medications -  No data to display   Initial Impression / Assessment and Plan / UC Course  I have reviewed the triage vital signs and the nursing notes.  Pertinent labs & imaging results that were available during my care of the patient were reviewed by me and considered in my medical decision making (see chart for details).     Final Clinical Impressions(s) / UC Diagnoses   Final diagnoses:  Plantar fasciitis of right foot    New Prescriptions New Prescriptions   No medications on file     Robyn Haber, MD 12/02/16 1924

## 2016-12-02 NOTE — ED Triage Notes (Signed)
The patient presented to the [facility] with a complaint of pain to the right foot and heal for about 5 months.

## 2017-04-14 ENCOUNTER — Encounter: Payer: Self-pay | Admitting: Podiatry

## 2017-04-14 ENCOUNTER — Ambulatory Visit (INDEPENDENT_AMBULATORY_CARE_PROVIDER_SITE_OTHER): Payer: Medicare Other

## 2017-04-14 ENCOUNTER — Ambulatory Visit (INDEPENDENT_AMBULATORY_CARE_PROVIDER_SITE_OTHER): Payer: Medicare Other | Admitting: Podiatry

## 2017-04-14 VITALS — BP 129/72 | HR 54 | Resp 16

## 2017-04-14 DIAGNOSIS — M722 Plantar fascial fibromatosis: Secondary | ICD-10-CM | POA: Diagnosis not present

## 2017-04-14 MED ORDER — TRIAMCINOLONE ACETONIDE 10 MG/ML IJ SUSP
10.0000 mg | Freq: Once | INTRAMUSCULAR | Status: AC
  Administered 2017-04-14: 10 mg

## 2017-04-14 MED ORDER — DICLOFENAC SODIUM 75 MG PO TBEC: 75.0000 mg | DELAYED_RELEASE_TABLET | Freq: Two times a day (BID) | ORAL | 2 refills | Status: DC

## 2017-04-14 NOTE — Progress Notes (Signed)
Subjective:    Patient ID: Henry Tate, male   DOB: 66 y.o.   MRN: [id number]   HPI patient presents with approximate 6 month history of heel pain right. States he had 1 previous cortisone injection which only helped him temporarily and the pain has been quite intense with palpation and worse after periods of ambulation patient is not a smoker and likes to be active but this is reducing his activity    Review of Systems  All other systems reviewed and are negative.       Objective:  Physical Exam  Constitutional: He appears well-developed and well-nourished.  Cardiovascular: Intact distal pulses.   Pulmonary/Chest: Effort normal.  Musculoskeletal: Normal range of motion.  Neurological: He is alert.  Skin: Skin is warm.  Nursing note and vitals reviewed.  neurovascular status intact muscle strength adequate range of motion within normal limits with patient found to have exquisite discomfort plantar aspect right heel at the insertional point of the tendon into the calcaneus with inflammation fluid around the medial band. Patient's found have good digital perfusion and is well oriented 3     Assessment:   Acute plantar fasciitis right with inflammation fluid around the medial band      Plan:    H&P condition reviewed and injected the fascia 3 mg Kenalog 5 mill grams Xylocaine and applied fascial brace gave instructions on physical therapy supportive therapy and placed on diclofenac 75 mg twice a day. Reappoint 3 weeks and may require orthotics  X-rays was negative for signs of fracture at this time with no indications for significant spur formation

## 2017-04-14 NOTE — Progress Notes (Signed)
   Subjective:    Patient ID: Henry Tate, male    DOB: 12-08-1950, 66 y.o.   MRN: [id number]  HPI    Review of Systems  All other systems reviewed and are negative.      Objective:   Physical Exam        Assessment & Plan:

## 2017-04-14 NOTE — Patient Instructions (Signed)

## 2017-05-05 ENCOUNTER — Ambulatory Visit (INDEPENDENT_AMBULATORY_CARE_PROVIDER_SITE_OTHER): Payer: Medicare Other | Admitting: Podiatry

## 2017-05-05 ENCOUNTER — Encounter: Payer: Self-pay | Admitting: Podiatry

## 2017-05-05 DIAGNOSIS — M722 Plantar fascial fibromatosis: Secondary | ICD-10-CM | POA: Diagnosis not present

## 2017-05-05 MED ORDER — TRIAMCINOLONE ACETONIDE 10 MG/ML IJ SUSP
10.0000 mg | Freq: Once | INTRAMUSCULAR | Status: AC
  Administered 2017-05-05: 10 mg

## 2017-05-05 NOTE — Progress Notes (Signed)
Subjective:    Patient ID: Henry Tate, male   DOB: 66 y.o.   MRN: [id number]   HPI patient states feeling a lot better with minimal discomfort    ROS      Objective:  Physical Exam neurovascular status intact with discomfort still present but improved     Assessment:    Fasciitis resident but improving     Plan:    Advised on anti-inflammatories physical therapy and supportive shoes and reappoint to recheck

## 2017-06-18 DIAGNOSIS — H25813 Combined forms of age-related cataract, bilateral: Secondary | ICD-10-CM | POA: Diagnosis not present

## 2017-06-18 DIAGNOSIS — H02054 Trichiasis without entropian left upper eyelid: Secondary | ICD-10-CM | POA: Diagnosis not present

## 2017-06-18 DIAGNOSIS — H04123 Dry eye syndrome of bilateral lacrimal glands: Secondary | ICD-10-CM | POA: Diagnosis not present

## 2017-06-18 DIAGNOSIS — H524 Presbyopia: Secondary | ICD-10-CM | POA: Diagnosis not present

## 2017-07-05 DIAGNOSIS — M25512 Pain in left shoulder: Secondary | ICD-10-CM | POA: Diagnosis not present

## 2017-07-08 DIAGNOSIS — M25512 Pain in left shoulder: Secondary | ICD-10-CM | POA: Diagnosis not present

## 2017-07-13 DIAGNOSIS — M25512 Pain in left shoulder: Secondary | ICD-10-CM | POA: Diagnosis not present

## 2017-07-15 DIAGNOSIS — M25512 Pain in left shoulder: Secondary | ICD-10-CM | POA: Diagnosis not present

## 2017-07-19 DIAGNOSIS — M25512 Pain in left shoulder: Secondary | ICD-10-CM | POA: Diagnosis not present

## 2017-07-22 DIAGNOSIS — M25512 Pain in left shoulder: Secondary | ICD-10-CM | POA: Diagnosis not present

## 2017-07-27 DIAGNOSIS — M25512 Pain in left shoulder: Secondary | ICD-10-CM | POA: Diagnosis not present

## 2017-07-29 DIAGNOSIS — M25512 Pain in left shoulder: Secondary | ICD-10-CM | POA: Diagnosis not present

## 2017-08-02 DIAGNOSIS — M25512 Pain in left shoulder: Secondary | ICD-10-CM | POA: Diagnosis not present

## 2017-08-06 DIAGNOSIS — M25512 Pain in left shoulder: Secondary | ICD-10-CM | POA: Diagnosis not present

## 2017-08-11 DIAGNOSIS — M25512 Pain in left shoulder: Secondary | ICD-10-CM | POA: Diagnosis not present

## 2017-08-13 DIAGNOSIS — M25512 Pain in left shoulder: Secondary | ICD-10-CM | POA: Diagnosis not present

## 2017-08-16 DIAGNOSIS — M25512 Pain in left shoulder: Secondary | ICD-10-CM | POA: Diagnosis not present

## 2017-08-19 DIAGNOSIS — M25512 Pain in left shoulder: Secondary | ICD-10-CM | POA: Diagnosis not present

## 2017-09-22 IMAGING — CT CT ABD-PELV W/O CM
2 of 4 series · 15 of 46 positions shown, 17 images · non-contrast
Comparison: None

CLINICAL DATA: Lower abdominal pain and microscopic hematuria for 2
days, leukocytosis, question kidney stone

EXAM:
CT ABDOMEN AND PELVIS WITHOUT CONTRAST
TECHNIQUE: Multidetector CT imaging of the abdomen and pelvis was performed
following the standard protocol without IV contrast. Sagittal and
coronal MPR images reconstructed from axial data set. IV contrast
was not administered due to renal dysfunction bike testing at time
of imaging.
Creatinine was obtained on site at [HOSPITAL] at [HOSPITAL].
Results: Creatinine 2.1 mg/dL.

[Series 2: abd/pelvis w/(date) · axial · 0.66mm/px · z∈[+80,+495]mm · 12 of 99 slices shown, 14 images]
[im 8/99  soft-tissue]
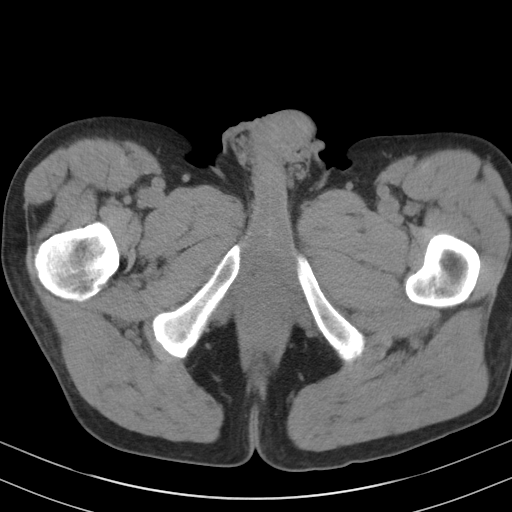
[im 8/99  bone]
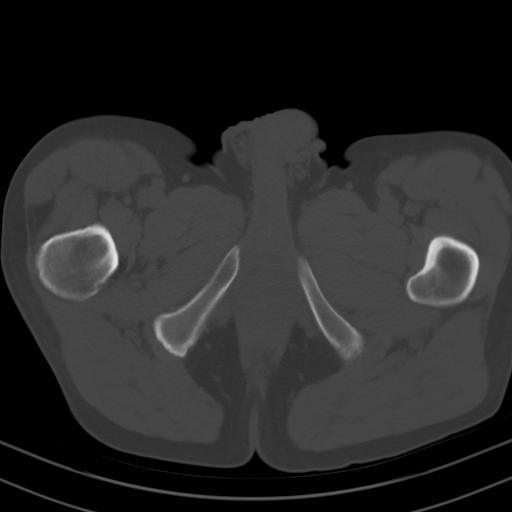
[im 16/99  soft-tissue]
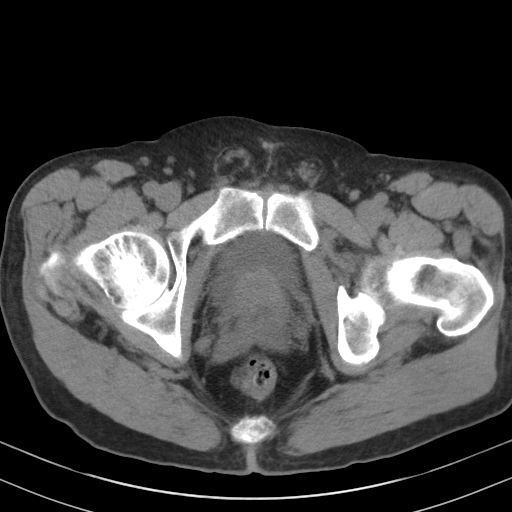
[im 23/99  soft-tissue]
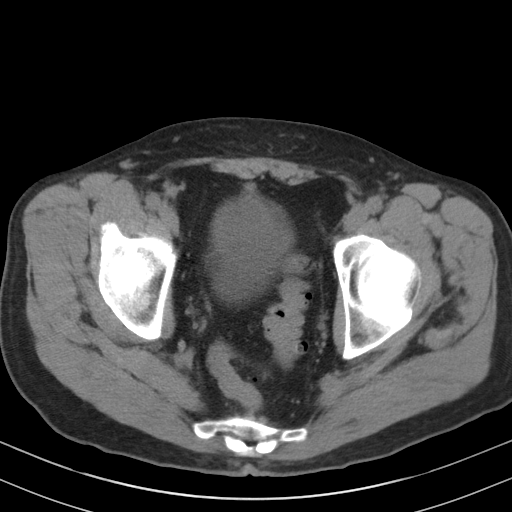
[im 31/99  soft-tissue]
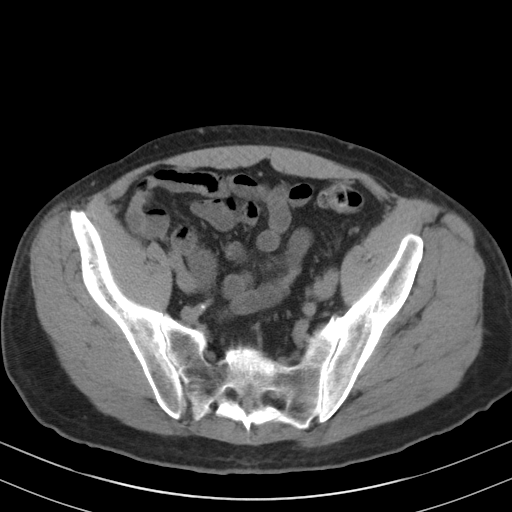
[im 38/99  soft-tissue]
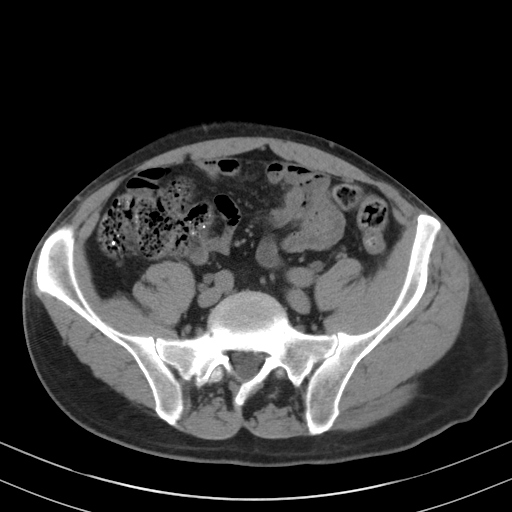
[im 46/99  soft-tissue]
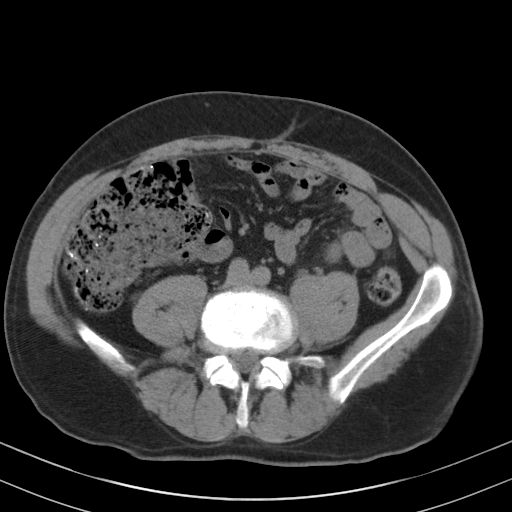
[im 53/99  soft-tissue]
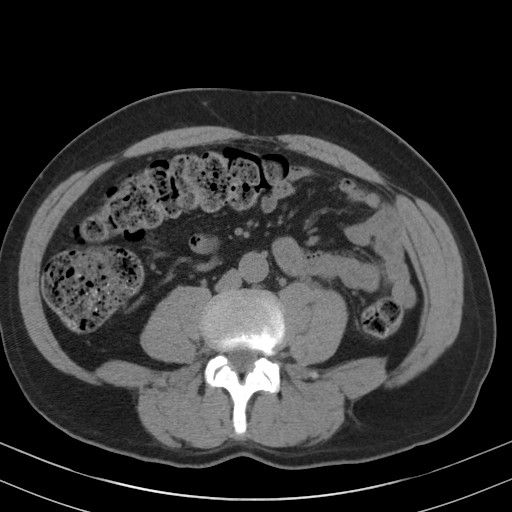
[im 61/99  soft-tissue]
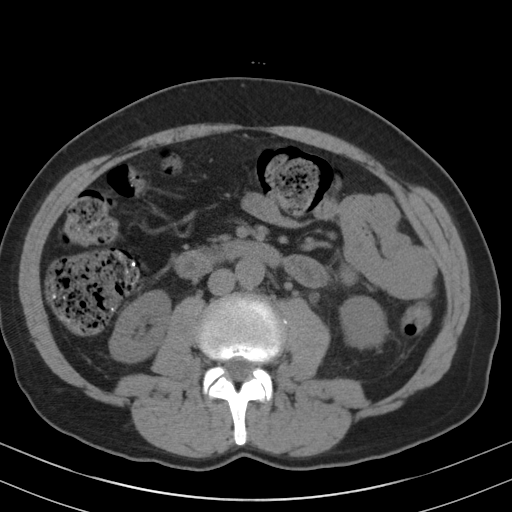
[im 68/99  soft-tissue]
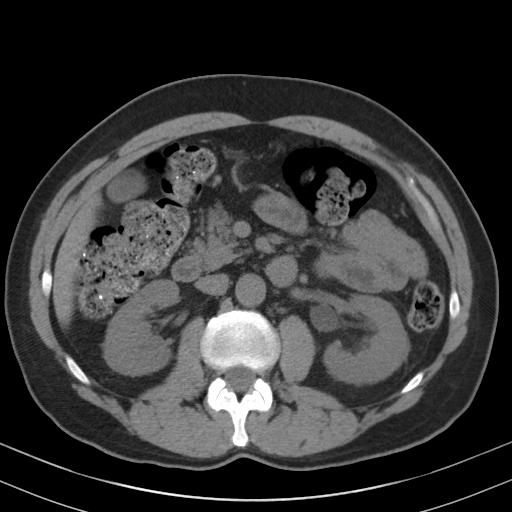
[im 68/99  bone]
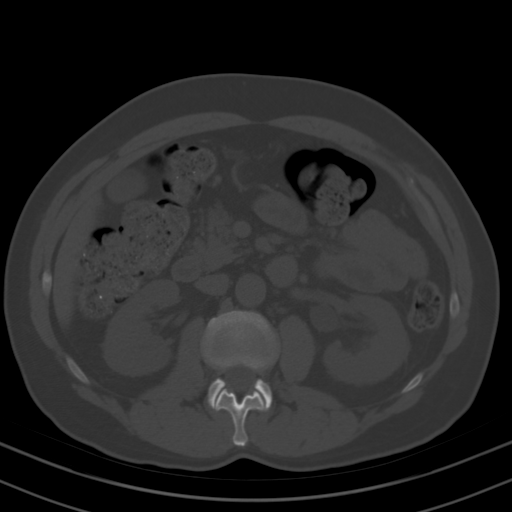
[im 76/99  soft-tissue]
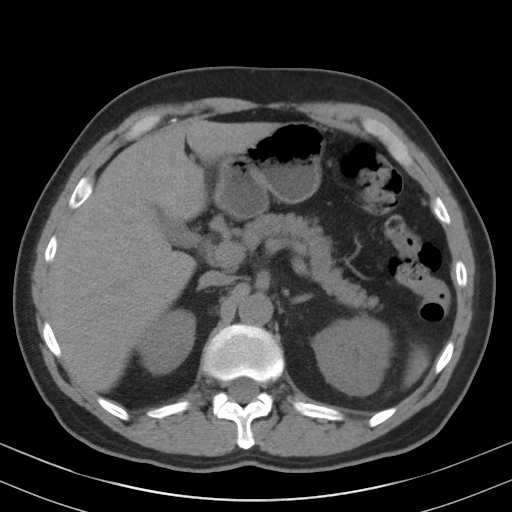
[im 83/99  soft-tissue]
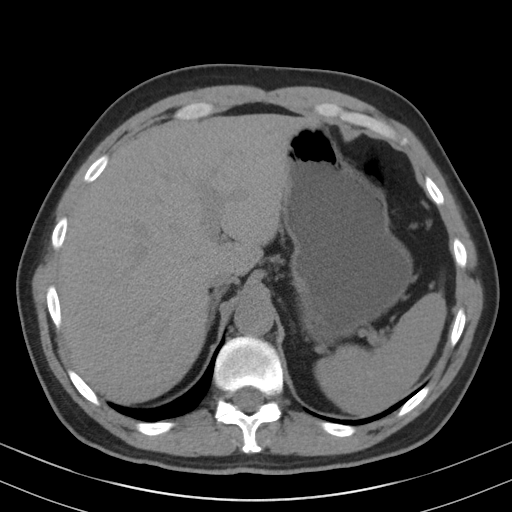
[im 91/99  soft-tissue]
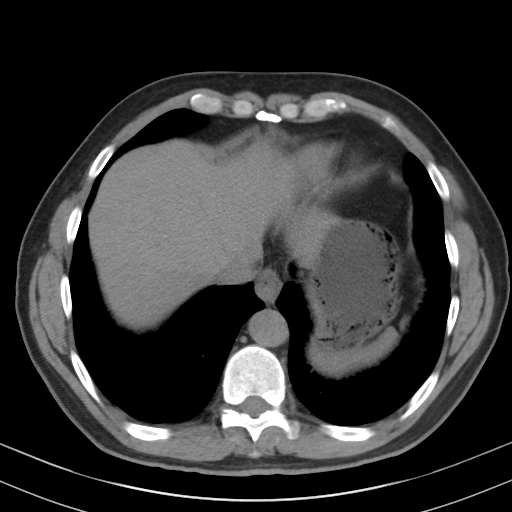

[Series 3: cor · coronal · 0.63mm/px · 3 of 82 slices shown]
[im 28/82  soft-tissue]
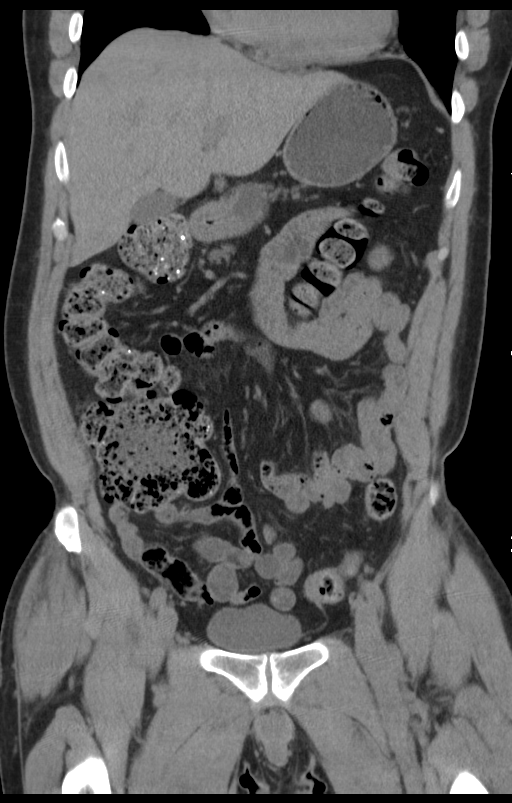
[im 37/82  soft-tissue]
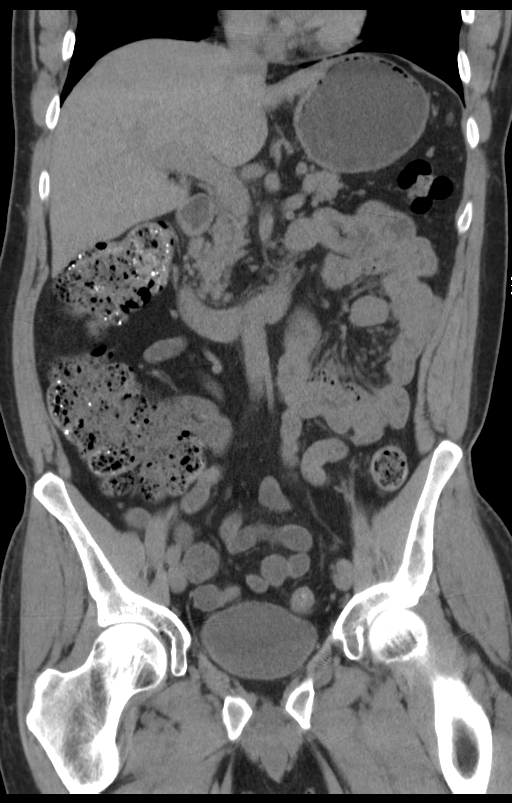
[im 46/82  soft-tissue]
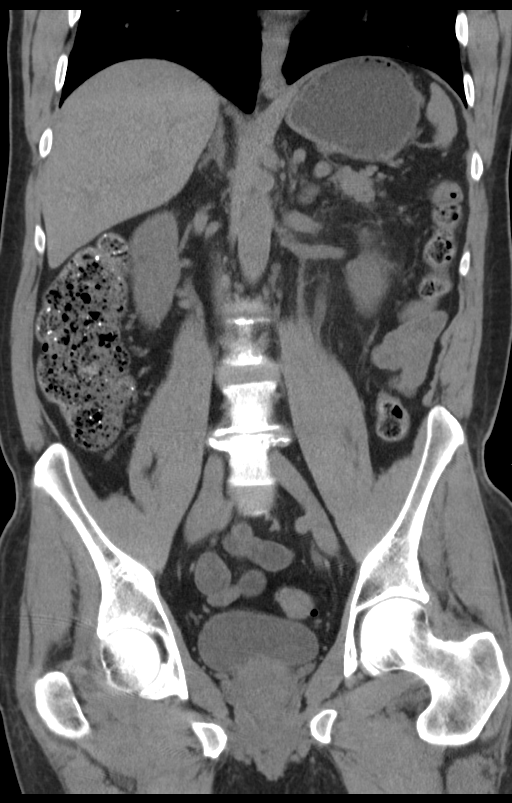

[15 of 46 positions shown; findings below may reference images not displayed]

FINDINGS: Lung bases clear.

Mild LEFT hydronephrosis and hydroureter secondary to a 3 mm
calculus at the LEFT ureterovesical junction.

Additional tiny nonobstructing calculi at upper and lower poles LEFT
kidney.

Within limits of a nonenhanced exam, no additional focal
abnormalities of the liver, spleen, pancreas, kidneys, or adrenal
glands identified.

Multiple tiny ingested radio opacities within stool of the RIGHT
colon.

Normal appendix.

Stomach and bowel loops unremarkable for technique.

No mass, adenopathy, free air or free fluid.

Question tiny RIGHT inguinal hernia containing fat.

Degenerative disc disease changes lumbar spine with questionable
vertebral hemangioma at L3.
IMPRESSION: Mild LEFT hydronephrosis and hydroureter secondary to a 3 mm LEFT
UVJ calculus.

Additional tiny nonobstructing LEFT renal calculi.

Question tiny RIGHT inguinal hernia containing fat.

Renal dysfunction, with creatinine measured at 2.1 mg/dl at time of
imaging.

## 2018-03-04 DIAGNOSIS — L570 Actinic keratosis: Secondary | ICD-10-CM | POA: Diagnosis not present

## 2018-03-04 DIAGNOSIS — Z1283 Encounter for screening for malignant neoplasm of skin: Secondary | ICD-10-CM | POA: Diagnosis not present

## 2018-03-04 DIAGNOSIS — X32XXXD Exposure to sunlight, subsequent encounter: Secondary | ICD-10-CM | POA: Diagnosis not present

## 2018-03-04 DIAGNOSIS — D225 Melanocytic nevi of trunk: Secondary | ICD-10-CM | POA: Diagnosis not present

## 2018-07-04 ENCOUNTER — Ambulatory Visit (HOSPITAL_COMMUNITY)
Admission: EM | Admit: 2018-07-04 | Discharge: 2018-07-04 | Disposition: A | Discharge status: Home / Self Care | Payer: Medicare Other | Attending: Family Medicine | Admitting: Family Medicine

## 2018-07-04 ENCOUNTER — Other Ambulatory Visit: Payer: Self-pay

## 2018-07-04 ENCOUNTER — Encounter (HOSPITAL_COMMUNITY): Payer: Self-pay

## 2018-07-04 DIAGNOSIS — Z20828 Contact with and (suspected) exposure to other viral communicable diseases: Secondary | ICD-10-CM | POA: Diagnosis not present

## 2018-07-04 NOTE — ED Provider Notes (Signed)
[place]    CSN: [id number] Arrival date & time: 07/04/18  1754     History   Chief Complaint Chief Complaint  Patient presents with  . sore throat    HPI Henry Tate is a 67 y.o. male.   HPI  Patient is largely well.  He is in good health and on no medication.  His wife was diagnosed today with influenza.  His wife's physician recommended that he be placed on Tamiflu.  He is unable to reach his personal physician.  He is here hoping to get the prophylactic Tamiflu.  He states he is also worried because as the day has gone on he is developed some scratchy throat.  He wants to know if he is having flu that starting already.  No fever chills.  No body aches.  History reviewed. No pertinent past medical history.  There are no active problems to display for this patient.   Past Surgical History:  Procedure Laterality Date  . COLONOSCOPY    . UPPER GASTROINTESTINAL ENDOSCOPY         Home Medications    Prior to Admission medications   Medication Sig Start Date End Date Taking? Authorizing Provider  diclofenac (VOLTAREN) 75 MG EC tablet Take 1 tablet (75 mg total) by mouth 2 (two) times daily. 04/14/17   Wallene Huh, DPM    Family History Family History  Problem Relation Age of Onset  . Depression Mother   . Cancer Mother        breast cancer  . Mental retardation Mother   . Pulmonary embolism Father   . Colon cancer Neg Hx     Social History Social History   Tobacco Use  . Smoking status: Never Smoker  . Smokeless tobacco: Never Used  Substance Use Topics  . Alcohol use: Yes    Alcohol/week: 0.0 standard drinks    Comment: rarely  . Drug use: No     Allergies   Patient has no known allergies.   Review of Systems Review of Systems  Constitutional: Negative for chills and fever.  HENT: Positive for sore throat. Negative for ear pain.   Eyes: Negative for pain and visual disturbance.  Respiratory: Negative for cough and  shortness of breath.   Cardiovascular: Negative for chest pain and palpitations.  Gastrointestinal: Negative for abdominal pain and vomiting.  Genitourinary: Negative for dysuria and hematuria.  Musculoskeletal: Negative for arthralgias and back pain.  Skin: Negative for color change and rash.  Neurological: Negative for seizures and syncope.  All other systems reviewed and are negative.    Physical Exam Triage Vital Signs ED Triage Vitals  Enc Vitals Group     BP 07/04/18 1912 (!) 141/72     Pulse Rate 07/04/18 1912 67     Resp 07/04/18 1912 18     Temp 07/04/18 1912 98.2 F (36.8 C)     Temp src --      SpO2 07/04/18 1912 100 %     Weight 07/04/18 1910 166 lb (75.3 kg)     Height --      Head Circumference --      Peak Flow --      Pain Score 07/04/18 2000 4     Pain Loc --      Pain Edu? --      Excl. in [place]? --    No data found.  Updated Vital Signs BP (!) 141/72 (BP Location: Right Arm)  Pulse 67   Temp 98.2 F (36.8 C)   Resp 18   Wt 75.3 kg   SpO2 100%   BMI 23.15 kg/m   Visual Acuity Right Eye Distance:   Left Eye Distance:   Bilateral Distance:    Right Eye Near:   Left Eye Near:    Bilateral Near:     Physical Exam Constitutional:      General: He is not in acute distress.    Appearance: He is well-developed.  HENT:     Head: Normocephalic and atraumatic.     Right Ear: Tympanic membrane and ear canal normal.     Left Ear: Tympanic membrane and ear canal normal.     Nose: Nose normal.     Mouth/Throat:     Comments: Mild erythema of posterior pharynx.  No adenopathy Eyes:     Conjunctiva/sclera: Conjunctivae normal.     Pupils: Pupils are equal, round, and reactive to light.  Neck:     Musculoskeletal: Normal range of motion.  Cardiovascular:     Rate and Rhythm: Normal rate and regular rhythm.     Heart sounds: Normal heart sounds.  Pulmonary:     Effort: Pulmonary effort is normal. No respiratory distress.     Breath sounds: Normal  breath sounds.  Abdominal:     General: There is no distension.     Palpations: Abdomen is soft.  Musculoskeletal: Normal range of motion.  Skin:    General: Skin is warm and dry.  Neurological:     Mental Status: He is alert.      UC Treatments / Results  Labs (all labs ordered are listed, but only abnormal results are displayed) Labs Reviewed - No data to display  EKG None  Radiology No results found.  Procedures Procedures (including critical care time)  Medications Ordered in UC Medications - No data to display  Initial Impression / Assessment and Plan / UC Course  I have reviewed the triage vital signs and the nursing notes.  Pertinent labs & imaging results that were available during my care of the patient were reviewed by me and considered in my medical decision making (see chart for details).     Do not think he is showing early signs of influenza with a scratchy throat.  Usually it is a more sudden onset of fever chills and body aches.  He is given a prescription for Tamiflu 75 once a day for 10 days. Final Clinical Impressions(s) / UC Diagnoses   Final diagnoses:  Exposure to the flu     Discharge Instructions     Take the tamiflu daily for 10 days Drink plenty of water Return as needed   ED Prescriptions    None     Controlled Substance Prescriptions [place] Controlled Substance Registry consulted? Not Applicable   Raylene Everts, MD 07/04/18 [phone]

## 2018-07-04 NOTE — Discharge Instructions (Addendum)
Take the tamiflu daily for 10 days Drink plenty of water Return as needed

## 2018-07-04 NOTE — ED Triage Notes (Signed)
Pt cc has a scratchy throat started today.

## 2018-07-11 DIAGNOSIS — H25813 Combined forms of age-related cataract, bilateral: Secondary | ICD-10-CM | POA: Diagnosis not present

## 2018-07-11 DIAGNOSIS — H5310 Unspecified subjective visual disturbances: Secondary | ICD-10-CM | POA: Diagnosis not present

## 2018-07-11 DIAGNOSIS — H02054 Trichiasis without entropian left upper eyelid: Secondary | ICD-10-CM | POA: Diagnosis not present

## 2018-07-11 DIAGNOSIS — H524 Presbyopia: Secondary | ICD-10-CM | POA: Diagnosis not present

## 2018-09-15 DIAGNOSIS — M7711 Lateral epicondylitis, right elbow: Secondary | ICD-10-CM | POA: Diagnosis not present

## 2018-09-19 DIAGNOSIS — M7711 Lateral epicondylitis, right elbow: Secondary | ICD-10-CM | POA: Diagnosis not present

## 2019-04-11 DIAGNOSIS — Z20828 Contact with and (suspected) exposure to other viral communicable diseases: Secondary | ICD-10-CM | POA: Diagnosis not present

## 2019-04-11 DIAGNOSIS — Z03818 Encounter for observation for suspected exposure to other biological agents ruled out: Secondary | ICD-10-CM | POA: Diagnosis not present

## 2019-04-14 ENCOUNTER — Ambulatory Visit (INDEPENDENT_AMBULATORY_CARE_PROVIDER_SITE_OTHER): Payer: Medicare Other

## 2019-04-14 ENCOUNTER — Encounter (HOSPITAL_COMMUNITY): Payer: Self-pay

## 2019-04-14 ENCOUNTER — Ambulatory Visit (HOSPITAL_COMMUNITY)
Admission: EM | Admit: 2019-04-14 | Discharge: 2019-04-14 | Disposition: A | Discharge status: Home / Self Care | Payer: Medicare Other | Attending: Family Medicine | Admitting: Family Medicine

## 2019-04-14 ENCOUNTER — Other Ambulatory Visit: Payer: Self-pay

## 2019-04-14 DIAGNOSIS — R0789 Other chest pain: Secondary | ICD-10-CM

## 2019-04-14 DIAGNOSIS — R079 Chest pain, unspecified: Secondary | ICD-10-CM | POA: Diagnosis not present

## 2019-04-14 MED ORDER — OMEPRAZOLE 20 MG PO CPDR: 20.0000 mg | DELAYED_RELEASE_CAPSULE | Freq: Two times a day (BID) | ORAL | 1 refills | Status: DC

## 2019-04-14 NOTE — ED Triage Notes (Signed)
Pt presents with what he believes to be as heartburn symptoms; central chest pain for over a month that is intermittent thru out the day.

## 2019-04-14 NOTE — ED Provider Notes (Signed)
[place]    CSN: [id number] Arrival date & time: 04/14/19  1058      History   Chief Complaint Chief Complaint  Patient presents with  . Heartburn    HPI Henry Tate is a 68 y.o. male.   Established patient at [facility]  68 yo man with substernal pressure that has been increasing over several weeks.  This is not related to position or activity or eating.  Does not awaken at night.  He recently had a COVID-19 test to be sure, results pending.  The symptom seems to be worsening.  He has a h/o esophageal stricture that responded to dilation, but he's had no swallowing issues with this chest symptom.  Nonsmoker.       History reviewed. No pertinent past medical history.  There are no active problems to display for this patient.   Past Surgical History:  Procedure Laterality Date  . COLONOSCOPY    . UPPER GASTROINTESTINAL ENDOSCOPY         Home Medications    Prior to Admission medications   Medication Sig Start Date End Date Taking? Authorizing Provider  omeprazole (PRILOSEC) 20 MG capsule Take 1 capsule (20 mg total) by mouth 2 (two) times daily before a meal. 04/14/19   Robyn Haber, MD    Family History Family History  Problem Relation Age of Onset  . Depression Mother   . Cancer Mother        breast cancer  . Mental retardation Mother   . Pulmonary embolism Father   . Colon cancer Neg Hx     Social History Social History   Tobacco Use  . Smoking status: Never Smoker  . Smokeless tobacco: Never Used  Substance Use Topics  . Alcohol use: Yes    Alcohol/week: 0.0 standard drinks    Comment: rarely  . Drug use: No     Allergies   Patient has no known allergies.   Review of Systems Review of Systems  Respiratory: Positive for chest tightness.   All other systems reviewed and are negative.    Physical Exam Triage Vital Signs ED Triage Vitals [04/14/19 1144]  Enc Vitals Group     BP (!) 148/75     Pulse Rate (!)  59     Resp 17     Temp 98.4 F (36.9 C)     Temp Source Oral     SpO2 100 %     Weight      Height      Head Circumference      Peak Flow      Pain Score      Pain Loc      Pain Edu?      Excl. in [place]?    No data found.  Updated Vital Signs BP (!) 148/75 (BP Location: Right Arm)   Pulse (!) 59   Temp 98.4 F (36.9 C) (Oral)   Resp 17   SpO2 100%   Physical Exam Vitals signs and nursing note reviewed.  Constitutional:      Appearance: Normal appearance. He is normal weight.  Eyes:     Conjunctiva/sclera: Conjunctivae normal.  Neck:     Musculoskeletal: Normal range of motion and neck supple.  Cardiovascular:     Rate and Rhythm: Normal rate and regular rhythm.     Heart sounds: Normal heart sounds.  Pulmonary:     Effort: Pulmonary effort is normal.     Breath sounds: Normal breath  sounds.  Musculoskeletal: Normal range of motion.  Skin:    General: Skin is warm and dry.  Neurological:     General: No focal deficit present.     Mental Status: He is alert and oriented to person, place, and time.  Psychiatric:        Mood and Affect: Mood normal.        Behavior: Behavior normal.        Thought Content: Thought content normal.        Judgment: Judgment normal.      UC Treatments / Results  Labs (all labs ordered are listed, but only abnormal results are displayed) Labs Reviewed - No data to display  EKG   Radiology No results found.  Procedures Procedures (including critical care time)  Medications Ordered in UC Medications - No data to display  Initial Impression / Assessment and Plan / UC Course  I have reviewed the triage vital signs and the nursing notes.  Pertinent labs & imaging results that were available during my care of the patient were reviewed by me and considered in my medical decision making (see chart for details).    Final Clinical Impressions(s) / UC Diagnoses   Final diagnoses:  Sensation of chest pressure      Discharge Instructions     Avoid voltaren, ibuprofen products   If pain is not better by Monday, let me know.  Final reading on chest x-ray is pending but preliminary reading is NORMAL    ED Prescriptions    Medication Sig Dispense Auth. Provider   omeprazole (PRILOSEC) 20 MG capsule Take 1 capsule (20 mg total) by mouth 2 (two) times daily before a meal. 30 capsule Robyn Haber, MD     PDMP not reviewed this encounter.   Robyn Haber, MD 04/14/19 1231

## 2019-04-14 NOTE — Discharge Instructions (Addendum)
Avoid voltaren, ibuprofen products   If pain is not better by Monday, let me know.  Final reading on chest x-ray is pending but preliminary reading is NORMAL

## 2019-04-27 ENCOUNTER — Encounter: Payer: Self-pay | Admitting: Internal Medicine

## 2019-06-07 ENCOUNTER — Encounter: Payer: Self-pay | Admitting: Internal Medicine

## 2019-06-07 ENCOUNTER — Other Ambulatory Visit: Payer: Self-pay

## 2019-06-07 ENCOUNTER — Ambulatory Visit (INDEPENDENT_AMBULATORY_CARE_PROVIDER_SITE_OTHER): Payer: Medicare Other | Admitting: Internal Medicine

## 2019-06-07 ENCOUNTER — Ambulatory Visit (INDEPENDENT_AMBULATORY_CARE_PROVIDER_SITE_OTHER): Payer: Medicare Other

## 2019-06-07 VITALS — BP 138/86 | HR 85 | Temp 98.2°F | Ht 71.0 in | Wt 165.8 lb

## 2019-06-07 DIAGNOSIS — R1310 Dysphagia, unspecified: Secondary | ICD-10-CM | POA: Diagnosis not present

## 2019-06-07 DIAGNOSIS — K5730 Diverticulosis of large intestine without perforation or abscess without bleeding: Secondary | ICD-10-CM

## 2019-06-07 DIAGNOSIS — Z1159 Encounter for screening for other viral diseases: Secondary | ICD-10-CM

## 2019-06-07 DIAGNOSIS — K219 Gastro-esophageal reflux disease without esophagitis: Secondary | ICD-10-CM | POA: Diagnosis not present

## 2019-06-07 DIAGNOSIS — K222 Esophageal obstruction: Secondary | ICD-10-CM | POA: Diagnosis not present

## 2019-06-07 NOTE — Patient Instructions (Signed)
You have been scheduled for an endoscopy. Please follow written instructions given to you at your visit today. If you use inhalers (even only as needed), please bring them with you on the day of your procedure.   

## 2019-06-07 NOTE — Progress Notes (Signed)
HISTORY OF PRESENT ILLNESS:  Henry Tate is a 68 y.o. male, owner of [employer], who has been followed in this office for prior history of GERD and symptomatic esophageal stricture as well as colon cancer screening.  The patient was last seen December 2015 for routine colon cancer screening.  He was found to have moderate sigmoid diverticulosis.  Otherwise normal.  Follow-up in 10 years recommended.  He presents today with chief complaint of lump-like sensation in his throat.  He reports to me that in September he had an uncomfortable feeling in his chest.  He was evaluated at [facility] urgent care center April 14, 2019.  Review of testing shows negative Covid testing.  Review of x-ray shows unremarkable chest x-ray.  He was diagnosed with GERD and prescribed omeprazole.  He tells me that he took this for 2 weeks and did not notice change in symptoms.  He last underwent upper endoscopy in May 2003.  He was found to have an esophageal stricture or web at 34 cm.  This was dilated.  He was also felt to have GERD and placed on PPI therapy.  He had been off PPI therapy for many years without issues.  He denies pyrosis or true esophageal dysphagia.  REVIEW OF SYSTEMS:  All non-GI ROS negative unless otherwise stated in the HPI.  Past Medical History:  Diagnosis Date  . Kidney stones 2015    Past Surgical History:  Procedure Laterality Date  . COLONOSCOPY    . UPPER GASTROINTESTINAL ENDOSCOPY      Social History JALON CREPEAU  reports that he has never smoked. He has never used smokeless tobacco. He reports current alcohol use. He reports that he does not use drugs.  family history includes Cancer in his mother; Depression in his mother; Mental retardation in his mother; Pulmonary embolism in his father.  No Known Allergies     PHYSICAL EXAMINATION: Vital signs: BP 138/86 (BP Location: Left Arm, Patient Position: Sitting)   Pulse 85   Temp 98.2 F (36.8 C)   Ht 5\' 11"   (1.803 m)   Wt 165 lb 12.8 oz (75.2 kg)   SpO2 98%   BMI 23.12 kg/m   Constitutional: generally well-appearing, no acute distress Psychiatric: alert and oriented x3, cooperative Eyes: extraocular movements intact, anicteric, conjunctiva pink Mouth: oral pharynx moist, no lesions Neck: supple no lymphadenopathy Cardiovascular: heart regular rate and rhythm, no murmur Lungs: clear to auscultation bilaterally Abdomen: soft, nontender, nondistended, no obvious ascites, no peritoneal signs, normal bowel sounds, no organomegaly Rectal: Omitted Extremities: no clubbing, cyanosis, or lower extremity edema bilaterally Skin: no lesions on visible extremities Neuro: No focal deficits.  Cranial nerves intact  ASSESSMENT:  1.  Globus type sensation. 2.  History of GERD and esophageal stricture. 3.  Diverticulosis. 4.  Negative screening colonoscopy 2015   PLAN:  1.  Schedule upper endoscopy.The nature of the procedure, as well as the risks, benefits, and alternatives were carefully and thoroughly reviewed with the patient. Ample time for discussion and questions allowed. The patient understood, was satisfied, and agreed to proceed. 2.  Likely prescribe pantoprazole 40 mg daily post endoscopy if reflux found or if normal. 3.  Follow-up thereafter to be arranged 4.  Follow-up routine screening colonoscopy 2025 A copy of this dictation has been sent to Dr. Geofrey Art

## 2019-06-08 LAB — SARS CORONAVIRUS 2 (TAT 6-24 HRS): SARS Coronavirus 2: NEGATIVE

## 2019-06-09 ENCOUNTER — Other Ambulatory Visit: Payer: Self-pay

## 2019-06-09 ENCOUNTER — Ambulatory Visit (AMBULATORY_SURGERY_CENTER): Payer: Medicare Other | Admitting: Internal Medicine

## 2019-06-09 ENCOUNTER — Encounter: Payer: Self-pay | Admitting: Internal Medicine

## 2019-06-09 VITALS — BP 140/61 | HR 50 | Temp 97.7°F | Resp 13 | Ht 71.0 in | Wt 165.0 lb

## 2019-06-09 DIAGNOSIS — R0989 Other specified symptoms and signs involving the circulatory and respiratory systems: Secondary | ICD-10-CM | POA: Diagnosis not present

## 2019-06-09 DIAGNOSIS — K219 Gastro-esophageal reflux disease without esophagitis: Secondary | ICD-10-CM | POA: Diagnosis not present

## 2019-06-09 DIAGNOSIS — R1310 Dysphagia, unspecified: Secondary | ICD-10-CM | POA: Diagnosis not present

## 2019-06-09 DIAGNOSIS — K2970 Gastritis, unspecified, without bleeding: Secondary | ICD-10-CM

## 2019-06-09 MED ORDER — PANTOPRAZOLE SODIUM 40 MG PO TBEC: 40.0000 mg | DELAYED_RELEASE_TABLET | Freq: Every day | ORAL | 3 refills | Status: DC

## 2019-06-09 MED ORDER — SODIUM CHLORIDE 0.9 % IV SOLN: 500.0000 mL | Freq: Once | INTRAVENOUS | Status: DC

## 2019-06-09 NOTE — Progress Notes (Signed)
Temp  LC  VS NCPt's states no medical or surgical changes since previsit or office visit.

## 2019-06-09 NOTE — Patient Instructions (Signed)
Thank you for allowing [place] to care for you today!  Await pathology results, approximately 1-2 weeks.  Will notify you of results.  Resume previous diet today.  Prescription for Pantoprazole 40 mg by mouth daily with refills has been sent to your pharmacy.  Follow up with Dr Henrene Pastor in about 6 weeks.  We will call to schedule this with you.  Return to your normal activities tomorrow.      YOU HAD AN ENDOSCOPIC PROCEDURE TODAY AT [facility]:   Refer to the procedure report that was given to you for any specific questions about what was found during the examination.  If the procedure report does not answer your questions, please call your gastroenterologist to clarify.  If you requested that your care partner not be given the details of your procedure findings, then the procedure report has been included in a sealed envelope for you to review at your convenience later.  YOU SHOULD EXPECT: Some feelings of bloating in the abdomen. Passage of more gas than usual.  Walking can help get rid of the air that was put into your GI tract during the procedure and reduce the bloating. If you had a lower endoscopy (such as a colonoscopy or flexible sigmoidoscopy) you may notice spotting of blood in your stool or on the toilet paper. If you underwent a bowel prep for your procedure, you may not have a normal bowel movement for a few days.  Please Note:  You might notice some irritation and congestion in your nose or some drainage.  This is from the oxygen used during your procedure.  There is no need for concern and it should clear up in a day or so.  SYMPTOMS TO REPORT IMMEDIATELY:     Following upper endoscopy (EGD)  Vomiting of blood or coffee ground material  New chest pain or pain under the shoulder blades  Painful or persistently difficult swallowing  New shortness of breath  Fever of 100F or higher  Black, tarry-looking stools  For urgent or emergent issues, a  gastroenterologist can be reached at any hour by calling [phone].   DIET:  We do recommend a small meal at first, but then you may proceed to your regular diet.  Drink plenty of fluids but you should avoid alcoholic beverages for 24 hours.  ACTIVITY:  You should plan to take it easy for the rest of today and you should NOT DRIVE or use heavy machinery until tomorrow (because of the sedation medicines used during the test).    FOLLOW UP: Our staff will call the number listed on your records 48-72 hours following your procedure to check on you and address any questions or concerns that you may have regarding the information given to you following your procedure. If we do not reach you, we will leave a message.  We will attempt to reach you two times.  During this call, we will ask if you have developed any symptoms of COVID 19. If you develop any symptoms (ie: fever, flu-like symptoms, shortness of breath, cough etc.) before then, please call [phone].  If you test positive for Covid 19 in the 2 weeks post procedure, please call and report this information to [place].    If any biopsies were taken you will be contacted by phone or by letter within the next 1-3 weeks.  Please call us at [phone] if you have not heard about the biopsies in 3 weeks.    SIGNATURES/CONFIDENTIALITY: You  and/or your care partner have signed paperwork which will be entered into your electronic medical record.  These signatures attest to the fact that that the information above on your After Visit Summary has been reviewed and is understood.  Full responsibility of the confidentiality of this discharge information lies with you and/or your care-partner.

## 2019-06-09 NOTE — Op Note (Signed)
[facility] Patient Name: Henry Tate Procedure Date: 06/09/2019 10:07 AM MRN: [id number] Endoscopist: Docia Chuck. Henrene Pastor , MD Age: 68 Referring MD:  Date of Birth: [birth year] Gender: Male Account #: [id number] Procedure:                Upper GI endoscopy with biopsies Indications:              dysphagia (globus sensation). Recent chest                            symptoms. History of GERD with esophageal                            stricture. On no therapy. Medicines:                Monitored Anesthesia Care Procedure:                Pre-Anesthesia Assessment:                           - Prior to the procedure, a History and Physical                            was performed, and patient medications and                            allergies were reviewed. The patient's tolerance of                            previous anesthesia was also reviewed. The risks                            and benefits of the procedure and the sedation                            options and risks were discussed with the patient.                            All questions were answered, and informed consent                            was obtained. Prior Anticoagulants: The patient has                            taken no previous anticoagulant or antiplatelet                            agents. ASA Grade Assessment: I - A normal, healthy                            patient. After reviewing the risks and benefits,                            the patient was deemed in satisfactory condition to  undergo the procedure.                           After obtaining informed consent, the endoscope was                            passed under direct vision. Throughout the                            procedure, the patient's blood pressure, pulse, and                            oxygen saturations were monitored continuously. The                            Endoscope was introduced through the  mouth, and                            advanced to the second part of duodenum. The upper                            GI endoscopy was accomplished without difficulty.                            The patient tolerated the procedure well. Scope In: Scope Out: Findings:                 The esophagus revealed a distal peptic stricture                            measuring approximately 14 to 15 mm in diameter.                            There was associated esophagitis. This was located                            at the gastroesophageal junction (39 cm from the                            incisors). The esophagus was otherwise normal..                           The stomach revealed few diminutive benign gastric                            polyps and a few small antral erosions but was                            otherwise normal. Biopsies were taken with a cold                            forceps for Helicobacter pylori testing using  CLOtest.                           The examined duodenum was normal.                           The cardia and gastric fundus were normal on                            retroflexion. Complications:            No immediate complications. Estimated Blood Loss:     Estimated blood loss: none. Impression:               1. GERD with peptic stricture and esophagitis                           2. Antral erosions status post CLO biopsy                           3. Otherwise unremarkable exam. Recommendation:           1. Prescribe pantoprazole 40 mg daily; #30; 11                            refills                           2. Resume previous diet and medications                           3. Please schedule office follow-up with Dr. Henrene Pastor                            in about 6 weeks                           [employer] Docia Chuck. Henrene Pastor, MD 06/09/2019 10:30:02 AM This report has been signed electronically.

## 2019-06-09 NOTE — Progress Notes (Signed)
Report to PACU, RN, vss, BBS= Clear.  

## 2019-06-09 NOTE — Progress Notes (Signed)
Called to room to assist during endoscopic procedure.  Patient ID and intended procedure confirmed with present staff. Received instructions for my participation in the procedure from the performing physician.  

## 2019-06-12 LAB — HELICOBACTER PYLORI SCREEN-BIOPSY: UREASE: NEGATIVE

## 2019-06-13 ENCOUNTER — Telehealth: Payer: Self-pay | Admitting: *Deleted

## 2019-06-13 NOTE — Telephone Encounter (Signed)
Follow up call made. Left message

## 2019-06-13 NOTE — Telephone Encounter (Signed)
Message left

## 2019-06-15 ENCOUNTER — Encounter: Payer: Self-pay | Admitting: Podiatry

## 2019-06-15 ENCOUNTER — Ambulatory Visit (INDEPENDENT_AMBULATORY_CARE_PROVIDER_SITE_OTHER): Payer: Medicare Other

## 2019-06-15 ENCOUNTER — Ambulatory Visit (INDEPENDENT_AMBULATORY_CARE_PROVIDER_SITE_OTHER): Payer: Medicare Other | Admitting: Podiatry

## 2019-06-15 ENCOUNTER — Other Ambulatory Visit: Payer: Self-pay

## 2019-06-15 DIAGNOSIS — M2021 Hallux rigidus, right foot: Secondary | ICD-10-CM

## 2019-06-15 DIAGNOSIS — M722 Plantar fascial fibromatosis: Secondary | ICD-10-CM | POA: Diagnosis not present

## 2019-06-15 NOTE — Progress Notes (Signed)
Subjective:   Patient ID: Henry Tate, male   DOB: 68 y.o.   MRN: [id number]   HPI Patient has discomfort in the bottom of both the medial lateral side of the right heel that is been present for about a month and also is now getting pain in his big toe joint which is been present for a shorter period of time but is quite inflamed when he tries to wear certain types of shoes that he has   ROS      Objective:  Physical Exam  Neurovascular status intact with patient found to have 2 separate problems with 1 being inflammation of the plantar fascial right with fluid buildup second being inflammation around the first MPJ right with moderate reduction motion of the joint surface     Assessment:  Acute plantar fasciitis right that was present 2 years ago and is present again and 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  I went ahead did sterile prep injected the plantar fascia 3 mg Kenalog 5 mg Xylocaine applied fascial brace to lift up the arch discussed rigid bottom shoes discussed hallux limitus rigidus and the fact at one point future may require shortening osteotomy or other procedure.  Patient will be seen back in several weeks to reevaluate  X-rays indicate that there is some flattening of the first metatarsal head right with elevation elongation first metatarsal segment

## 2019-06-28 ENCOUNTER — Encounter: Payer: Self-pay | Admitting: Podiatry

## 2019-06-28 ENCOUNTER — Ambulatory Visit (INDEPENDENT_AMBULATORY_CARE_PROVIDER_SITE_OTHER): Payer: Medicare Other | Admitting: Podiatry

## 2019-06-28 ENCOUNTER — Other Ambulatory Visit: Payer: Self-pay

## 2019-06-28 DIAGNOSIS — M722 Plantar fascial fibromatosis: Secondary | ICD-10-CM | POA: Diagnosis not present

## 2019-06-28 DIAGNOSIS — M2021 Hallux rigidus, right foot: Secondary | ICD-10-CM

## 2019-06-28 NOTE — Progress Notes (Signed)
Subjective:   Patient ID: Henry Tate, male   DOB: 68 y.o.   MRN: [id number]   HPI Patient states he feels like he is standing on something on the bottom of his right foot and something is almost come out of his foot and the area that we inject is better but this is bothering him.  Also was concerned about the big toe joint right   ROS      Objective:  Physical Exam  Neurovascular status intact what appears to be on the lateral side of the joint a small area of irritation with tissue formation with a mild reduction of motion first MPJ     Assessment:  Possibility for foreign body right with improved fasciitis and hallux limitus deformity right foot     Plan:  H&P sterile debridement of the area and I was able to take out a small bit of foreign material which may be a small piece of metal or other foreign body.  No drainage was noted no break in skin patient states foot feels much better.  Discussed hallux limitus do not recommend any surgery but it may be necessary 1 point in future which I educated him on today

## 2019-07-17 DIAGNOSIS — H5203 Hypermetropia, bilateral: Secondary | ICD-10-CM | POA: Diagnosis not present

## 2019-07-17 DIAGNOSIS — H524 Presbyopia: Secondary | ICD-10-CM | POA: Diagnosis not present

## 2019-07-17 DIAGNOSIS — H25813 Combined forms of age-related cataract, bilateral: Secondary | ICD-10-CM | POA: Diagnosis not present

## 2019-07-25 ENCOUNTER — Encounter: Payer: Self-pay | Admitting: Internal Medicine

## 2019-07-25 ENCOUNTER — Ambulatory Visit (INDEPENDENT_AMBULATORY_CARE_PROVIDER_SITE_OTHER): Payer: Medicare Other | Admitting: Internal Medicine

## 2019-07-25 VITALS — BP 140/72 | HR 68 | Temp 98.7°F | Ht 71.0 in | Wt 170.0 lb

## 2019-07-25 DIAGNOSIS — R1310 Dysphagia, unspecified: Secondary | ICD-10-CM | POA: Diagnosis not present

## 2019-07-25 DIAGNOSIS — K219 Gastro-esophageal reflux disease without esophagitis: Secondary | ICD-10-CM | POA: Diagnosis not present

## 2019-07-25 DIAGNOSIS — K222 Esophageal obstruction: Secondary | ICD-10-CM | POA: Diagnosis not present

## 2019-07-25 DIAGNOSIS — R0989 Other specified symptoms and signs involving the circulatory and respiratory systems: Secondary | ICD-10-CM

## 2019-07-25 MED ORDER — PANTOPRAZOLE SODIUM 40 MG PO TBEC: 40.0000 mg | DELAYED_RELEASE_TABLET | Freq: Every day | ORAL | 3 refills | Status: AC

## 2019-07-25 NOTE — Patient Instructions (Signed)
We have sent the following medications to your pharmacy for you to pick up at your convenience:  Pantoprazole  Please follow up in one year  

## 2019-07-25 NOTE — Progress Notes (Signed)
HISTORY OF PRESENT ILLNESS:  Henry Tate is a 69 y.o. male who was evaluated June 07, 2019 regarding globus type sensation and chest discomfort.  He subsequently underwent upper endoscopy June 09, 2019.  He was found to have distal esophageal stricture and esophagitis as well as antral erosions.  Testing for Helicobacter pylori was negative.  He was placed on pantoprazole 40 mg daily and asked to follow-up at this time.  Patient is pleased to report that he has had complete resolution of all symptoms including globus sensation, chest discomfort, chronic throat clearing, and dysphagia.  He is tolerating the medication well but does have questions regarding long-term medication use.  REVIEW OF SYSTEMS:  All non-GI ROS negative entirely.  Past Medical History:  Diagnosis Date  . GERD (gastroesophageal reflux disease)     Past Surgical History:  Procedure Laterality Date  . COLONOSCOPY    . UPPER GASTROINTESTINAL ENDOSCOPY      Social History Henry Tate  reports that he has never smoked. He has never used smokeless tobacco. He reports current alcohol use. He reports that he does not use drugs.  family history includes Cancer in his mother; Depression in his mother; Mental retardation in his mother; Pulmonary embolism in his father.  No Known Allergies     PHYSICAL EXAMINATION: Vital signs: BP 140/72   Pulse 68   Temp 98.7 F (37.1 C)   Ht 5\' 11"  (1.803 m)   Wt 170 lb (77.1 kg)   BMI 23.71 kg/m   Constitutional: generally well-appearing, no acute distress Psychiatric: alert and oriented x3, cooperative Eyes: Anicteric Mouth: Mask Abdomen: Not reexamined Neuro: No gross deficits.  ASSESSMENT:  1.  GERD with esophagitis and peptic stricture.  Asymptomatic post endoscopy on PPI 2.  Colonoscopy 2015 with diverticulosis.  No neoplasia   PLAN:  1.  Reflux precautions 2.  Continue pantoprazole 40 mg daily.  The patient wishes to try this every other day.   That would be fine. 3.  Discussed medication risks with the patient. 4.  Office follow-up 1 year 30 minutes was spent preparing to see the patient obtaining the history, performing medically appropriate exam, counseling and educating the patient, ordering medications, and documenting clinical information in the health record

## 2019-07-26 ENCOUNTER — Ambulatory Visit: Payer: Medicare Other | Attending: Internal Medicine

## 2019-07-26 DIAGNOSIS — Z23 Encounter for immunization: Secondary | ICD-10-CM | POA: Insufficient documentation

## 2019-07-26 NOTE — Progress Notes (Signed)
covid

## 2019-08-14 ENCOUNTER — Ambulatory Visit: Payer: Medicare Other | Attending: Internal Medicine

## 2019-08-14 DIAGNOSIS — Z23 Encounter for immunization: Secondary | ICD-10-CM

## 2019-08-14 NOTE — Progress Notes (Signed)
   Covid-19 Vaccination Clinic  Name:  Henry Tate    MRN: [id number] DOB: February 10, 1951  08/14/2019  Henry Tate was observed post Covid-19 immunization for 15 minutes without incidence. He was provided with Vaccine Information Sheet and instruction to access the V-Safe system.   Henry Tate was instructed to call 911 with any severe reactions post vaccine: Marland Kitchen Difficulty breathing  . Swelling of your face and throat  . A fast heartbeat  . A bad rash all over your body  . Dizziness and weakness    Immunizations Administered    Name Date Dose VIS Date Route   Pfizer COVID-19 Vaccine 08/14/2019  5:19 PM 0.3 mL 06/16/2019 Intramuscular   Manufacturer: [place]   Lot: [id number]   [place]: [id number]

## 2019-08-23 ENCOUNTER — Ambulatory Visit: Payer: Medicare Other

## 2019-12-14 DIAGNOSIS — M545 Low back pain: Secondary | ICD-10-CM | POA: Diagnosis not present

## 2020-01-17 DIAGNOSIS — M545 Low back pain: Secondary | ICD-10-CM | POA: Diagnosis not present

## 2020-01-31 DIAGNOSIS — M545 Low back pain: Secondary | ICD-10-CM | POA: Diagnosis not present

## 2020-03-06 DIAGNOSIS — L818 Other specified disorders of pigmentation: Secondary | ICD-10-CM | POA: Diagnosis not present

## 2020-03-06 DIAGNOSIS — L720 Epidermal cyst: Secondary | ICD-10-CM | POA: Diagnosis not present

## 2020-03-29 DIAGNOSIS — Z23 Encounter for immunization: Secondary | ICD-10-CM | POA: Diagnosis not present

## 2020-06-04 DIAGNOSIS — M545 Low back pain, unspecified: Secondary | ICD-10-CM | POA: Diagnosis not present

## 2020-06-13 DIAGNOSIS — M545 Low back pain, unspecified: Secondary | ICD-10-CM | POA: Diagnosis not present

## 2020-06-18 DIAGNOSIS — M545 Low back pain, unspecified: Secondary | ICD-10-CM | POA: Diagnosis not present

## 2020-06-25 DIAGNOSIS — M545 Low back pain, unspecified: Secondary | ICD-10-CM | POA: Diagnosis not present

## 2020-07-11 DIAGNOSIS — Z23 Encounter for immunization: Secondary | ICD-10-CM | POA: Diagnosis not present

## 2020-07-11 DIAGNOSIS — M19042 Primary osteoarthritis, left hand: Secondary | ICD-10-CM | POA: Diagnosis not present

## 2020-07-11 DIAGNOSIS — K219 Gastro-esophageal reflux disease without esophagitis: Secondary | ICD-10-CM | POA: Diagnosis not present

## 2020-07-11 DIAGNOSIS — M7050 Other bursitis of knee, unspecified knee: Secondary | ICD-10-CM | POA: Diagnosis not present

## 2020-07-11 DIAGNOSIS — E785 Hyperlipidemia, unspecified: Secondary | ICD-10-CM | POA: Diagnosis not present

## 2020-07-23 DIAGNOSIS — M545 Low back pain, unspecified: Secondary | ICD-10-CM | POA: Diagnosis not present

## 2020-07-30 DIAGNOSIS — H25812 Combined forms of age-related cataract, left eye: Secondary | ICD-10-CM | POA: Diagnosis not present

## 2020-07-30 DIAGNOSIS — H268 Other specified cataract: Secondary | ICD-10-CM | POA: Diagnosis not present

## 2020-08-06 DIAGNOSIS — M545 Low back pain, unspecified: Secondary | ICD-10-CM | POA: Diagnosis not present

## 2020-08-13 DIAGNOSIS — H268 Other specified cataract: Secondary | ICD-10-CM | POA: Diagnosis not present

## 2020-08-13 DIAGNOSIS — H25811 Combined forms of age-related cataract, right eye: Secondary | ICD-10-CM | POA: Diagnosis not present

## 2020-09-03 DIAGNOSIS — M545 Low back pain, unspecified: Secondary | ICD-10-CM | POA: Diagnosis not present

## 2020-10-10 DIAGNOSIS — Z125 Encounter for screening for malignant neoplasm of prostate: Secondary | ICD-10-CM | POA: Diagnosis not present

## 2020-10-10 DIAGNOSIS — R7303 Prediabetes: Secondary | ICD-10-CM | POA: Diagnosis not present

## 2020-10-10 DIAGNOSIS — E785 Hyperlipidemia, unspecified: Secondary | ICD-10-CM | POA: Diagnosis not present

## 2020-10-17 DIAGNOSIS — R7303 Prediabetes: Secondary | ICD-10-CM | POA: Diagnosis not present

## 2020-10-17 DIAGNOSIS — E785 Hyperlipidemia, unspecified: Secondary | ICD-10-CM | POA: Diagnosis not present

## 2020-10-17 DIAGNOSIS — M7050 Other bursitis of knee, unspecified knee: Secondary | ICD-10-CM | POA: Diagnosis not present

## 2020-10-17 DIAGNOSIS — R030 Elevated blood-pressure reading, without diagnosis of hypertension: Secondary | ICD-10-CM | POA: Diagnosis not present

## 2020-10-17 DIAGNOSIS — Z1331 Encounter for screening for depression: Secondary | ICD-10-CM | POA: Diagnosis not present

## 2020-10-17 DIAGNOSIS — Z1339 Encounter for screening examination for other mental health and behavioral disorders: Secondary | ICD-10-CM | POA: Diagnosis not present

## 2020-10-17 DIAGNOSIS — R8281 Pyuria: Secondary | ICD-10-CM | POA: Diagnosis not present

## 2020-10-17 DIAGNOSIS — Z0000 Encounter for general adult medical examination without abnormal findings: Secondary | ICD-10-CM | POA: Diagnosis not present

## 2020-10-17 DIAGNOSIS — K219 Gastro-esophageal reflux disease without esophagitis: Secondary | ICD-10-CM | POA: Diagnosis not present

## 2020-10-17 DIAGNOSIS — M19042 Primary osteoarthritis, left hand: Secondary | ICD-10-CM | POA: Diagnosis not present

## 2020-12-13 DIAGNOSIS — Z23 Encounter for immunization: Secondary | ICD-10-CM | POA: Diagnosis not present

## 2020-12-31 DIAGNOSIS — Z1152 Encounter for screening for COVID-19: Secondary | ICD-10-CM | POA: Diagnosis not present

## 2020-12-31 DIAGNOSIS — J40 Bronchitis, not specified as acute or chronic: Secondary | ICD-10-CM | POA: Diagnosis not present

## 2020-12-31 DIAGNOSIS — R059 Cough, unspecified: Secondary | ICD-10-CM | POA: Diagnosis not present

## 2021-02-03 DIAGNOSIS — U071 COVID-19: Secondary | ICD-10-CM | POA: Diagnosis not present

## 2021-02-20 DIAGNOSIS — M545 Low back pain, unspecified: Secondary | ICD-10-CM | POA: Diagnosis not present

## 2021-03-13 DIAGNOSIS — M545 Low back pain, unspecified: Secondary | ICD-10-CM | POA: Diagnosis not present

## 2021-04-01 DIAGNOSIS — M545 Low back pain, unspecified: Secondary | ICD-10-CM | POA: Diagnosis not present

## 2021-04-02 DIAGNOSIS — L980 Pyogenic granuloma: Secondary | ICD-10-CM | POA: Diagnosis not present

## 2021-04-02 DIAGNOSIS — B078 Other viral warts: Secondary | ICD-10-CM | POA: Diagnosis not present

## 2021-04-02 DIAGNOSIS — X32XXXD Exposure to sunlight, subsequent encounter: Secondary | ICD-10-CM | POA: Diagnosis not present

## 2021-04-02 DIAGNOSIS — Z1283 Encounter for screening for malignant neoplasm of skin: Secondary | ICD-10-CM | POA: Diagnosis not present

## 2021-04-02 DIAGNOSIS — L570 Actinic keratosis: Secondary | ICD-10-CM | POA: Diagnosis not present

## 2021-04-02 DIAGNOSIS — D225 Melanocytic nevi of trunk: Secondary | ICD-10-CM | POA: Diagnosis not present

## 2021-04-11 ENCOUNTER — Ambulatory Visit (HOSPITAL_COMMUNITY)
Admission: EM | Admit: 2021-04-11 | Discharge: 2021-04-11 | Disposition: A | Discharge status: Home / Self Care | Payer: Medicare Other | Attending: Physician Assistant | Admitting: Physician Assistant

## 2021-04-11 ENCOUNTER — Other Ambulatory Visit: Payer: Self-pay

## 2021-04-11 ENCOUNTER — Encounter (HOSPITAL_COMMUNITY): Payer: Self-pay

## 2021-04-11 DIAGNOSIS — R519 Headache, unspecified: Secondary | ICD-10-CM

## 2021-04-11 DIAGNOSIS — J3489 Other specified disorders of nose and nasal sinuses: Secondary | ICD-10-CM | POA: Diagnosis not present

## 2021-04-11 DIAGNOSIS — J038 Acute tonsillitis due to other specified organisms: Secondary | ICD-10-CM | POA: Diagnosis not present

## 2021-04-11 DIAGNOSIS — B9689 Other specified bacterial agents as the cause of diseases classified elsewhere: Secondary | ICD-10-CM | POA: Diagnosis not present

## 2021-04-11 DIAGNOSIS — Z1152 Encounter for screening for COVID-19: Secondary | ICD-10-CM | POA: Diagnosis not present

## 2021-04-11 LAB — POC INFLUENZA A AND B ANTIGEN (URGENT CARE ONLY)
INFLUENZA A ANTIGEN, POC: NEGATIVE
INFLUENZA B ANTIGEN, POC: NEGATIVE

## 2021-04-11 MED ORDER — KETOROLAC TROMETHAMINE 30 MG/ML IJ SOLN
30.0000 mg | Freq: Once | INTRAMUSCULAR | Status: AC
Start: 2021-04-11 — End: 2021-04-11
  Administered 2021-04-11: 30 mg via INTRAMUSCULAR

## 2021-04-11 NOTE — ED Triage Notes (Signed)
Pt presents with c/o ear fullness since June. States he recently had an ear infection. States he started to have a headache yesterday. Pt states he has been taking Flonase.

## 2021-04-11 NOTE — Discharge Instructions (Signed)
Your flu test was negative.  We did not test you for COVID given you have had COVID within the past 90 days.  It is possible that you have a developing sinus infection or other viral illness that is causing your symptoms.  I do not see any signs of an acute infection that warrant starting antibiotics today.  We did give you an injection of ketorolac.  Do not take any NSAIDs for 24 hours (ibuprofen, aspirin, naproxen).  You can use Tylenol for additional pain relief.  I would recommend a follow-up with your primary care provider later this afternoon as scheduled.  If you have any worsening symptoms including weakness, difficulty speaking, vision changes, worsening headache, sudden severe headache you need to go to the emergency room.

## 2021-04-11 NOTE — ED Provider Notes (Signed)
[facility]    CSN: [id number] Arrival date & time: 04/11/21  1042      History   Chief Complaint Chief Complaint  Patient presents with   Headache    HPI ADDIEL MCCARDLE is a 70 y.o. male.   Patient presents today with a 2-day history of headache.  He describes this as a frontal headache that wraps around his head and is rated 3 on a 0-10 pain scale, described as aching, no aggravating relieving factors identified.  He has tried over-the-counter medications including Tylenol and Flonase without improvement of symptoms.  Denies any head injury.  Denies any medication change prior to symptom onset.  He does have a sensation of ear fullness as well as throbbing in his ears this has been ongoing since June following URI symptoms.  He has tried prednisone, antibiotics, Flonase without improvement of symptoms.  He denies any decreased hearing.  He denies any history of neurological condition including multiple sclerosis, migraines, primary headache condition.  He denies any dysarthria, focal weakness, vision changes, pain with mastication.  He did have COVID less than 3 months ago and is up-to-date on COVID vaccines.  He does report a fever with T-max of 100.2 F but denies additional URI symptoms including cough, congestion, sore throat, body aches.  Denies any known sick contacts.   Past Medical History:  Diagnosis Date   GERD (gastroesophageal reflux disease)     There are no problems to display for this patient.   Past Surgical History:  Procedure Laterality Date   COLONOSCOPY     UPPER GASTROINTESTINAL ENDOSCOPY         Home Medications    Prior to Admission medications   Medication Sig Start Date End Date Taking? Authorizing Provider  pantoprazole (PROTONIX) 40 MG tablet Take 1 tablet (40 mg total) by mouth daily. 07/25/19   Irene Shipper, MD    Family History Family History  Problem Relation Age of Onset   Depression Mother    Cancer Mother         breast cancer   Mental retardation Mother    Pulmonary embolism Father    Colon cancer Neg Hx    Esophageal cancer Neg Hx     Social History Social History   Tobacco Use   Smoking status: Never   Smokeless tobacco: Never  Substance Use Topics   Alcohol use: Yes    Alcohol/week: 0.0 standard drinks    Comment: rarely   Drug use: No     Allergies   Patient has no known allergies.   Review of Systems Review of Systems  Constitutional:  Positive for activity change and fever. Negative for appetite change and fatigue.  HENT:  Positive for ear pain. Negative for congestion, hearing loss, sinus pressure, sneezing and sore throat.   Respiratory:  Negative for cough and shortness of breath.   Cardiovascular:  Negative for chest pain.  Gastrointestinal:  Negative for abdominal pain, diarrhea, nausea and vomiting.  Musculoskeletal:  Negative for arthralgias and myalgias.  Neurological:  Positive for headaches. Negative for dizziness, seizures, speech difficulty, weakness, light-headedness and numbness.    Physical Exam Triage Vital Signs ED Triage Vitals  Enc Vitals Group     BP 04/11/21 1128 (!) 151/84     Pulse Rate 04/11/21 1128 65     Resp 04/11/21 1128 18     Temp 04/11/21 1128 98.6 F (37 C)     Temp Source 04/11/21 1128 Oral  SpO2 04/11/21 1128 99 %     Weight --      Height --      Head Circumference --      Peak Flow --      Pain Score 04/11/21 1127 0     Pain Loc --      Pain Edu? --      Excl. in [place]? --    No data found.  Updated Vital Signs BP (!) 148/84   Pulse 65   Temp 98.6 F (37 C) (Oral)   Resp 18   SpO2 99%   Visual Acuity Right Eye Distance:   Left Eye Distance:   Bilateral Distance:    Right Eye Near:   Left Eye Near:    Bilateral Near:     Physical Exam Vitals reviewed.  Constitutional:      General: He is awake.     Appearance: Normal appearance. He is well-developed. He is not ill-appearing.     Comments: Very pleasant  male appears stated age in no acute distress sitting comfortably in exam room  HENT:     Head: Normocephalic and atraumatic.     Right Ear: Tympanic membrane, ear canal and external ear normal. Tympanic membrane is not erythematous or bulging.     Left Ear: Tympanic membrane, ear canal and external ear normal. Tympanic membrane is not erythematous or bulging.     Nose: Nose normal.     Mouth/Throat:     Tongue: Tongue does not deviate from midline.     Pharynx: Uvula midline. No oropharyngeal exudate or posterior oropharyngeal erythema.  Eyes:     Extraocular Movements: Extraocular movements intact.     Pupils: Pupils are equal, round, and reactive to light.  Cardiovascular:     Rate and Rhythm: Normal rate and regular rhythm.     Heart sounds: Normal heart sounds, S1 normal and S2 normal. No murmur heard. Pulmonary:     Effort: Pulmonary effort is normal. No accessory muscle usage or respiratory distress.     Breath sounds: Normal breath sounds. No stridor. No wheezing, rhonchi or rales.     Comments: Clear to auscultation bilaterally Abdominal:     Palpations: Abdomen is soft.     Tenderness: There is no abdominal tenderness.  Musculoskeletal:     Cervical back: Normal range of motion and neck supple.     Comments: Strength 5/5 bilateral upper and lower extremities.  Neurological:     General: No focal deficit present.     Mental Status: He is alert.     Cranial Nerves: Cranial nerves are intact.     Motor: Motor function is intact. No weakness.     Coordination: Coordination is intact.     Gait: Gait is intact.     Comments: Cranial nerves II through XII intact.  No focal neurological defect on exam.  Psychiatric:        Mood and Affect: Mood normal.        Speech: Speech normal.        Behavior: Behavior is cooperative.     UC Treatments / Results  Labs (all labs ordered are listed, but only abnormal results are displayed) Labs Reviewed  POC INFLUENZA A AND B ANTIGEN  (URGENT CARE ONLY)    EKG   Radiology No results found.  Procedures Procedures (including critical care time)  Medications Ordered in UC Medications  ketorolac (TORADOL) 30 MG/ML injection 30 mg (has no administration in time range)  Initial Impression / Assessment and Plan / UC Course  I have reviewed the triage vital signs and the nursing notes.  Pertinent labs & imaging results that were available during my care of the patient were reviewed by me and considered in my medical decision making (see chart for details).      Vital signs and physical exam are reassuring today; no indication for emergent evaluation or imaging.  Patient is concerned that symptoms could be related to sinus infection or other illness.  There is no signs of acute infection that warrant initiation of antibiotics today.  Flu testing was negative.  No indication for repeat COVID testing given patient has been positive and recovered from [place] within the last 90 days.  Discussed that it is unusual to have headaches develop later in life and so recommended follow-up with primary care to determine if imaging or referral to neurology is required.  Patient has an appointment scheduled in 2 hours and will go to this appointment for further evaluation and management.  He was given ketorolac in clinic to help provide symptom relief with instruction not to take NSAIDs for 24 hours.  He can use Tylenol for breakthrough pain.  Discussed alarm symptoms that warrant emergent evaluation including weakness, vision changes, sudden severe headache, difficulty speaking.  Strict return ER precautions given to which patient expressed understanding.  Final Clinical Impressions(s) / UC Diagnoses   Final diagnoses:  Nonintractable headache, unspecified chronicity pattern, unspecified headache type     Discharge Instructions      Your flu test was negative.  We did not test you for COVID given you have had COVID within the past  90 days.  It is possible that you have a developing sinus infection or other viral illness that is causing your symptoms.  I do not see any signs of an acute infection that warrant starting antibiotics today.  We did give you an injection of ketorolac.  Do not take any NSAIDs for 24 hours (ibuprofen, aspirin, naproxen).  You can use Tylenol for additional pain relief.  I would recommend a follow-up with your primary care provider later this afternoon as scheduled.  If you have any worsening symptoms including weakness, difficulty speaking, vision changes, worsening headache, sudden severe headache you need to go to the emergency room.     ED Prescriptions   None    PDMP not reviewed this encounter.   Terrilee Croak, PA-C 04/11/21 1233

## 2021-04-18 ENCOUNTER — Other Ambulatory Visit: Payer: Self-pay | Admitting: Internal Medicine

## 2021-04-18 DIAGNOSIS — K219 Gastro-esophageal reflux disease without esophagitis: Secondary | ICD-10-CM | POA: Diagnosis not present

## 2021-04-18 DIAGNOSIS — H938X3 Other specified disorders of ear, bilateral: Secondary | ICD-10-CM | POA: Diagnosis not present

## 2021-04-18 DIAGNOSIS — R030 Elevated blood-pressure reading, without diagnosis of hypertension: Secondary | ICD-10-CM | POA: Diagnosis not present

## 2021-04-18 DIAGNOSIS — Z1339 Encounter for screening examination for other mental health and behavioral disorders: Secondary | ICD-10-CM | POA: Diagnosis not present

## 2021-04-18 DIAGNOSIS — R7303 Prediabetes: Secondary | ICD-10-CM | POA: Diagnosis not present

## 2021-04-18 DIAGNOSIS — Z7189 Other specified counseling: Secondary | ICD-10-CM | POA: Diagnosis not present

## 2021-04-18 DIAGNOSIS — Z0000 Encounter for general adult medical examination without abnormal findings: Secondary | ICD-10-CM

## 2021-04-18 DIAGNOSIS — E785 Hyperlipidemia, unspecified: Secondary | ICD-10-CM | POA: Diagnosis not present

## 2021-04-18 DIAGNOSIS — M7050 Other bursitis of knee, unspecified knee: Secondary | ICD-10-CM | POA: Diagnosis not present

## 2021-04-18 DIAGNOSIS — M19042 Primary osteoarthritis, left hand: Secondary | ICD-10-CM | POA: Diagnosis not present

## 2021-04-18 DIAGNOSIS — Z1331 Encounter for screening for depression: Secondary | ICD-10-CM | POA: Diagnosis not present

## 2021-05-12 DIAGNOSIS — M545 Low back pain, unspecified: Secondary | ICD-10-CM | POA: Diagnosis not present

## 2021-06-02 DIAGNOSIS — Z0489 Encounter for examination and observation for other specified reasons: Secondary | ICD-10-CM | POA: Diagnosis not present

## 2021-06-05 ENCOUNTER — Other Ambulatory Visit: Payer: Self-pay | Admitting: Oral Surgery

## 2021-06-05 DIAGNOSIS — D1039 Benign neoplasm of other parts of mouth: Secondary | ICD-10-CM | POA: Diagnosis not present

## 2021-06-05 DIAGNOSIS — K045 Chronic apical periodontitis: Secondary | ICD-10-CM | POA: Diagnosis not present

## 2021-06-05 DIAGNOSIS — K048 Radicular cyst: Secondary | ICD-10-CM | POA: Diagnosis not present

## 2021-06-06 IMAGING — DX DG CHEST 2V
2 series · 2 of 2 positions shown · non-contrast
Comparison: None.

CLINICAL DATA: Substernal chest pain for 3 weeks.

EXAM:
CHEST - 2 VIEW

[chest pa]
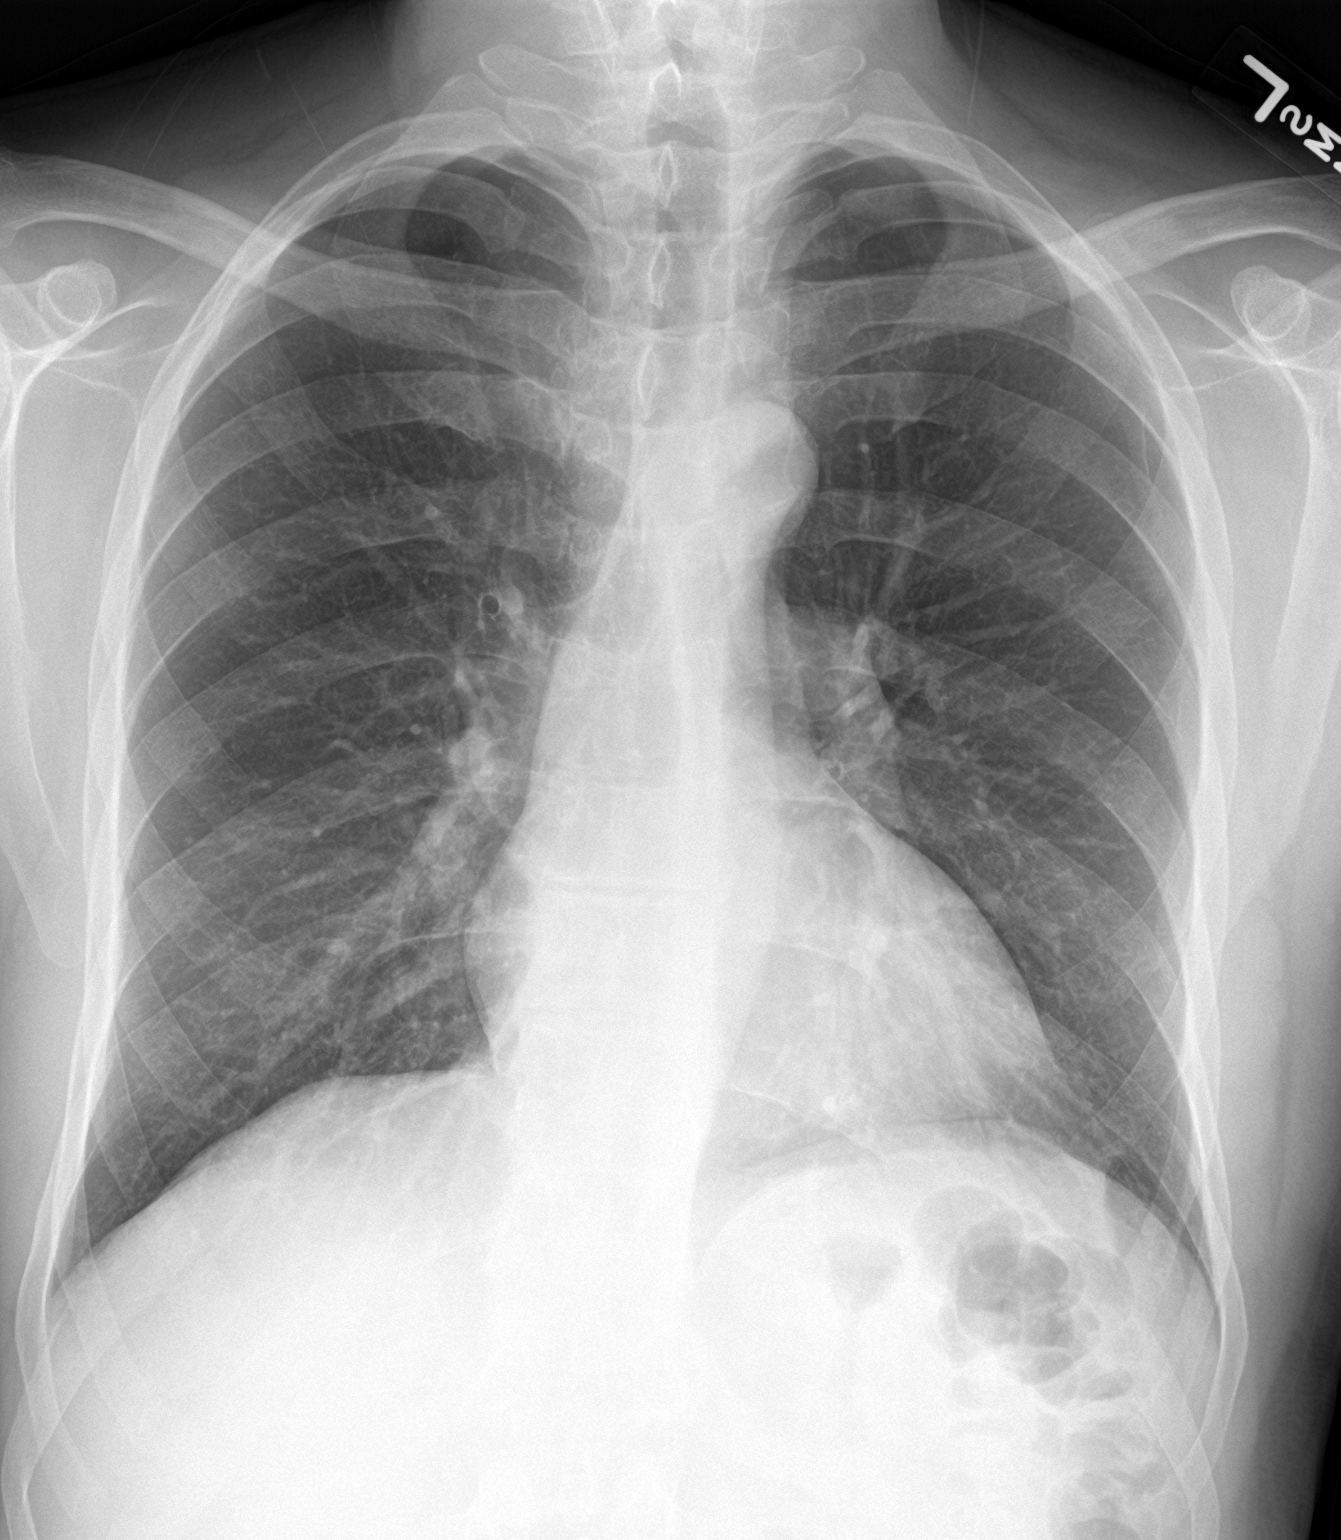

[chest lat]
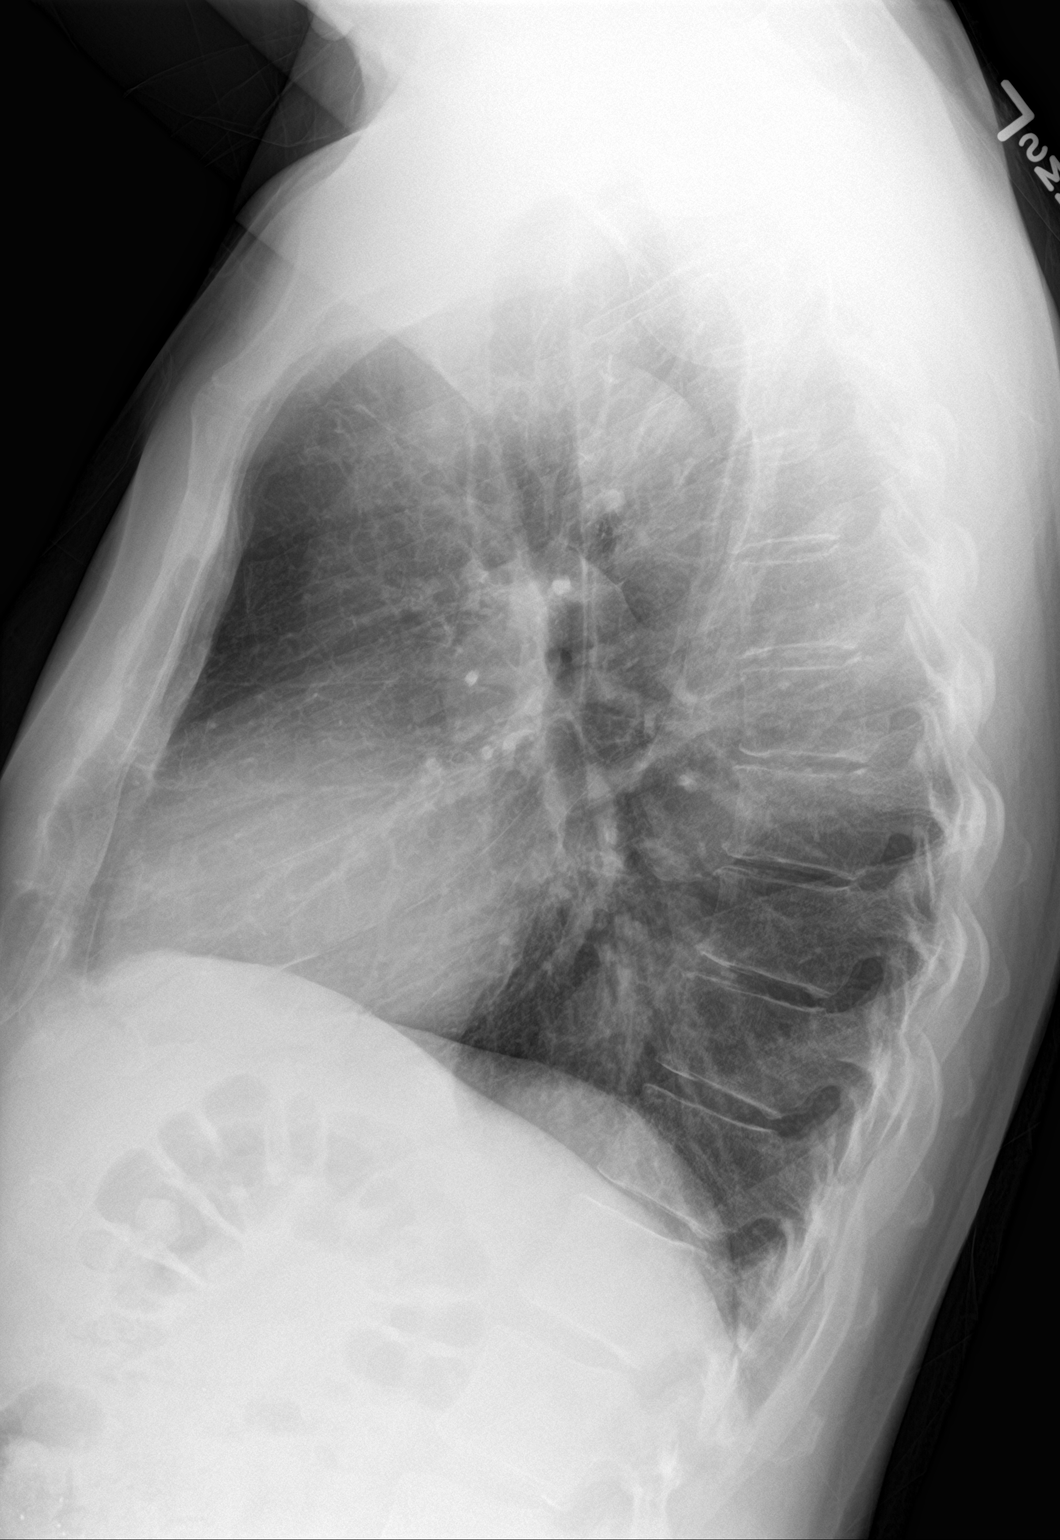

[2 of 2 positions shown; findings below may reference images not displayed]

FINDINGS: The heart size and mediastinal contours are within normal limits.
Both lungs are clear. The visualized skeletal structures are
unremarkable.
IMPRESSION: No active cardiopulmonary disease.

## 2021-06-11 DIAGNOSIS — Z23 Encounter for immunization: Secondary | ICD-10-CM | POA: Diagnosis not present

## 2021-06-24 DIAGNOSIS — M542 Cervicalgia: Secondary | ICD-10-CM | POA: Diagnosis not present

## 2021-06-24 DIAGNOSIS — M545 Low back pain, unspecified: Secondary | ICD-10-CM | POA: Diagnosis not present

## 2021-07-04 DIAGNOSIS — M545 Low back pain, unspecified: Secondary | ICD-10-CM | POA: Diagnosis not present

## 2021-07-04 DIAGNOSIS — M542 Cervicalgia: Secondary | ICD-10-CM | POA: Diagnosis not present

## 2021-07-09 DIAGNOSIS — M542 Cervicalgia: Secondary | ICD-10-CM | POA: Diagnosis not present

## 2021-07-09 DIAGNOSIS — M545 Low back pain, unspecified: Secondary | ICD-10-CM | POA: Diagnosis not present

## 2021-07-11 DIAGNOSIS — H938X3 Other specified disorders of ear, bilateral: Secondary | ICD-10-CM | POA: Diagnosis not present

## 2021-07-11 DIAGNOSIS — H6121 Impacted cerumen, right ear: Secondary | ICD-10-CM | POA: Diagnosis not present

## 2021-07-15 DIAGNOSIS — M545 Low back pain, unspecified: Secondary | ICD-10-CM | POA: Diagnosis not present

## 2021-07-15 DIAGNOSIS — M542 Cervicalgia: Secondary | ICD-10-CM | POA: Diagnosis not present

## 2021-07-18 DIAGNOSIS — M545 Low back pain, unspecified: Secondary | ICD-10-CM | POA: Diagnosis not present

## 2021-07-18 DIAGNOSIS — M542 Cervicalgia: Secondary | ICD-10-CM | POA: Diagnosis not present

## 2021-07-29 DIAGNOSIS — M545 Low back pain, unspecified: Secondary | ICD-10-CM | POA: Diagnosis not present

## 2021-07-29 DIAGNOSIS — M542 Cervicalgia: Secondary | ICD-10-CM | POA: Diagnosis not present

## 2021-08-13 DIAGNOSIS — M545 Low back pain, unspecified: Secondary | ICD-10-CM | POA: Diagnosis not present

## 2021-08-13 DIAGNOSIS — M542 Cervicalgia: Secondary | ICD-10-CM | POA: Diagnosis not present

## 2021-08-15 DIAGNOSIS — Z0489 Encounter for examination and observation for other specified reasons: Secondary | ICD-10-CM | POA: Diagnosis not present

## 2021-08-19 DIAGNOSIS — K047 Periapical abscess without sinus: Secondary | ICD-10-CM | POA: Diagnosis not present

## 2021-08-20 DIAGNOSIS — M542 Cervicalgia: Secondary | ICD-10-CM | POA: Diagnosis not present

## 2021-08-20 DIAGNOSIS — M79605 Pain in left leg: Secondary | ICD-10-CM | POA: Diagnosis not present

## 2021-08-20 DIAGNOSIS — M545 Low back pain, unspecified: Secondary | ICD-10-CM | POA: Diagnosis not present

## 2021-08-27 DIAGNOSIS — H9041 Sensorineural hearing loss, unilateral, right ear, with unrestricted hearing on the contralateral side: Secondary | ICD-10-CM | POA: Diagnosis not present

## 2021-09-15 DIAGNOSIS — M79605 Pain in left leg: Secondary | ICD-10-CM | POA: Diagnosis not present

## 2021-09-15 DIAGNOSIS — M545 Low back pain, unspecified: Secondary | ICD-10-CM | POA: Diagnosis not present

## 2021-09-15 DIAGNOSIS — M542 Cervicalgia: Secondary | ICD-10-CM | POA: Diagnosis not present

## 2021-10-15 DIAGNOSIS — M542 Cervicalgia: Secondary | ICD-10-CM | POA: Diagnosis not present

## 2021-10-15 DIAGNOSIS — M545 Low back pain, unspecified: Secondary | ICD-10-CM | POA: Diagnosis not present

## 2021-10-15 DIAGNOSIS — M79605 Pain in left leg: Secondary | ICD-10-CM | POA: Diagnosis not present

## 2021-10-22 DIAGNOSIS — E785 Hyperlipidemia, unspecified: Secondary | ICD-10-CM | POA: Diagnosis not present

## 2021-10-22 DIAGNOSIS — R7989 Other specified abnormal findings of blood chemistry: Secondary | ICD-10-CM | POA: Diagnosis not present

## 2021-10-22 DIAGNOSIS — R030 Elevated blood-pressure reading, without diagnosis of hypertension: Secondary | ICD-10-CM | POA: Diagnosis not present

## 2021-10-22 DIAGNOSIS — Z125 Encounter for screening for malignant neoplasm of prostate: Secondary | ICD-10-CM | POA: Diagnosis not present

## 2021-10-22 DIAGNOSIS — R7303 Prediabetes: Secondary | ICD-10-CM | POA: Diagnosis not present

## 2021-10-29 DIAGNOSIS — Z0000 Encounter for general adult medical examination without abnormal findings: Secondary | ICD-10-CM | POA: Diagnosis not present

## 2021-10-29 DIAGNOSIS — D692 Other nonthrombocytopenic purpura: Secondary | ICD-10-CM | POA: Diagnosis not present

## 2021-10-29 DIAGNOSIS — R7303 Prediabetes: Secondary | ICD-10-CM | POA: Diagnosis not present

## 2021-10-29 DIAGNOSIS — R030 Elevated blood-pressure reading, without diagnosis of hypertension: Secondary | ICD-10-CM | POA: Diagnosis not present

## 2021-10-29 DIAGNOSIS — Z1331 Encounter for screening for depression: Secondary | ICD-10-CM | POA: Diagnosis not present

## 2021-10-29 DIAGNOSIS — H938X3 Other specified disorders of ear, bilateral: Secondary | ICD-10-CM | POA: Diagnosis not present

## 2021-10-29 DIAGNOSIS — K219 Gastro-esophageal reflux disease without esophagitis: Secondary | ICD-10-CM | POA: Diagnosis not present

## 2021-10-29 DIAGNOSIS — E785 Hyperlipidemia, unspecified: Secondary | ICD-10-CM | POA: Diagnosis not present

## 2021-10-29 DIAGNOSIS — Z1339 Encounter for screening examination for other mental health and behavioral disorders: Secondary | ICD-10-CM | POA: Diagnosis not present

## 2021-10-29 DIAGNOSIS — M7050 Other bursitis of knee, unspecified knee: Secondary | ICD-10-CM | POA: Diagnosis not present

## 2021-10-29 DIAGNOSIS — M19042 Primary osteoarthritis, left hand: Secondary | ICD-10-CM | POA: Diagnosis not present

## 2021-10-29 DIAGNOSIS — Z23 Encounter for immunization: Secondary | ICD-10-CM | POA: Diagnosis not present

## 2021-11-11 DIAGNOSIS — M542 Cervicalgia: Secondary | ICD-10-CM | POA: Diagnosis not present

## 2021-11-11 DIAGNOSIS — M545 Low back pain, unspecified: Secondary | ICD-10-CM | POA: Diagnosis not present

## 2021-11-11 DIAGNOSIS — M79605 Pain in left leg: Secondary | ICD-10-CM | POA: Diagnosis not present

## 2021-12-09 DIAGNOSIS — H524 Presbyopia: Secondary | ICD-10-CM | POA: Diagnosis not present

## 2021-12-09 DIAGNOSIS — H0100A Unspecified blepharitis right eye, upper and lower eyelids: Secondary | ICD-10-CM | POA: Diagnosis not present

## 2021-12-09 DIAGNOSIS — H43813 Vitreous degeneration, bilateral: Secondary | ICD-10-CM | POA: Diagnosis not present

## 2021-12-09 DIAGNOSIS — D23111 Other benign neoplasm of skin of right upper eyelid, including canthus: Secondary | ICD-10-CM | POA: Diagnosis not present

## 2021-12-11 DIAGNOSIS — M79605 Pain in left leg: Secondary | ICD-10-CM | POA: Diagnosis not present

## 2021-12-11 DIAGNOSIS — M545 Low back pain, unspecified: Secondary | ICD-10-CM | POA: Diagnosis not present

## 2021-12-11 DIAGNOSIS — M542 Cervicalgia: Secondary | ICD-10-CM | POA: Diagnosis not present

## 2022-01-08 DIAGNOSIS — M79605 Pain in left leg: Secondary | ICD-10-CM | POA: Diagnosis not present

## 2022-01-08 DIAGNOSIS — M542 Cervicalgia: Secondary | ICD-10-CM | POA: Diagnosis not present

## 2022-01-08 DIAGNOSIS — M545 Low back pain, unspecified: Secondary | ICD-10-CM | POA: Diagnosis not present

## 2022-02-05 DIAGNOSIS — M79605 Pain in left leg: Secondary | ICD-10-CM | POA: Diagnosis not present

## 2022-02-05 DIAGNOSIS — M542 Cervicalgia: Secondary | ICD-10-CM | POA: Diagnosis not present

## 2022-02-05 DIAGNOSIS — M545 Low back pain, unspecified: Secondary | ICD-10-CM | POA: Diagnosis not present

## 2022-03-10 DIAGNOSIS — M545 Low back pain, unspecified: Secondary | ICD-10-CM | POA: Diagnosis not present

## 2022-03-10 DIAGNOSIS — M542 Cervicalgia: Secondary | ICD-10-CM | POA: Diagnosis not present

## 2022-03-19 DIAGNOSIS — K08419 Partial loss of teeth due to trauma, unspecified class: Secondary | ICD-10-CM | POA: Diagnosis not present

## 2022-04-08 DIAGNOSIS — M545 Low back pain, unspecified: Secondary | ICD-10-CM | POA: Diagnosis not present

## 2022-04-08 DIAGNOSIS — M542 Cervicalgia: Secondary | ICD-10-CM | POA: Diagnosis not present

## 2022-05-07 DIAGNOSIS — M542 Cervicalgia: Secondary | ICD-10-CM | POA: Diagnosis not present

## 2022-05-07 DIAGNOSIS — M545 Low back pain, unspecified: Secondary | ICD-10-CM | POA: Diagnosis not present

## 2022-05-07 DIAGNOSIS — M23231 Derangement of other medial meniscus due to old tear or injury, right knee: Secondary | ICD-10-CM | POA: Diagnosis not present

## 2022-06-04 DIAGNOSIS — M542 Cervicalgia: Secondary | ICD-10-CM | POA: Diagnosis not present

## 2022-06-04 DIAGNOSIS — M23231 Derangement of other medial meniscus due to old tear or injury, right knee: Secondary | ICD-10-CM | POA: Diagnosis not present

## 2022-06-04 DIAGNOSIS — M545 Low back pain, unspecified: Secondary | ICD-10-CM | POA: Diagnosis not present

## 2022-07-03 DIAGNOSIS — Z23 Encounter for immunization: Secondary | ICD-10-CM | POA: Diagnosis not present

## 2022-07-03 DIAGNOSIS — M23231 Derangement of other medial meniscus due to old tear or injury, right knee: Secondary | ICD-10-CM | POA: Diagnosis not present

## 2022-07-03 DIAGNOSIS — M545 Low back pain, unspecified: Secondary | ICD-10-CM | POA: Diagnosis not present

## 2022-07-03 DIAGNOSIS — M542 Cervicalgia: Secondary | ICD-10-CM | POA: Diagnosis not present

## 2022-07-30 DIAGNOSIS — M545 Low back pain, unspecified: Secondary | ICD-10-CM | POA: Diagnosis not present

## 2022-07-30 DIAGNOSIS — M542 Cervicalgia: Secondary | ICD-10-CM | POA: Diagnosis not present

## 2022-07-30 DIAGNOSIS — M23231 Derangement of other medial meniscus due to old tear or injury, right knee: Secondary | ICD-10-CM | POA: Diagnosis not present

## 2022-09-01 DIAGNOSIS — M542 Cervicalgia: Secondary | ICD-10-CM | POA: Diagnosis not present

## 2022-09-01 DIAGNOSIS — M23231 Derangement of other medial meniscus due to old tear or injury, right knee: Secondary | ICD-10-CM | POA: Diagnosis not present

## 2022-09-01 DIAGNOSIS — M545 Low back pain, unspecified: Secondary | ICD-10-CM | POA: Diagnosis not present

## 2022-09-16 DIAGNOSIS — M545 Low back pain, unspecified: Secondary | ICD-10-CM | POA: Diagnosis not present

## 2022-09-16 DIAGNOSIS — M542 Cervicalgia: Secondary | ICD-10-CM | POA: Diagnosis not present

## 2022-09-16 DIAGNOSIS — M23231 Derangement of other medial meniscus due to old tear or injury, right knee: Secondary | ICD-10-CM | POA: Diagnosis not present

## 2022-10-14 DIAGNOSIS — M23231 Derangement of other medial meniscus due to old tear or injury, right knee: Secondary | ICD-10-CM | POA: Diagnosis not present

## 2022-10-14 DIAGNOSIS — M545 Low back pain, unspecified: Secondary | ICD-10-CM | POA: Diagnosis not present

## 2022-10-14 DIAGNOSIS — M542 Cervicalgia: Secondary | ICD-10-CM | POA: Diagnosis not present

## 2022-10-28 DIAGNOSIS — B351 Tinea unguium: Secondary | ICD-10-CM | POA: Diagnosis not present

## 2022-10-28 DIAGNOSIS — D225 Melanocytic nevi of trunk: Secondary | ICD-10-CM | POA: Diagnosis not present

## 2022-10-28 DIAGNOSIS — Z1283 Encounter for screening for malignant neoplasm of skin: Secondary | ICD-10-CM | POA: Diagnosis not present

## 2022-11-05 DIAGNOSIS — K219 Gastro-esophageal reflux disease without esophagitis: Secondary | ICD-10-CM | POA: Diagnosis not present

## 2022-11-05 DIAGNOSIS — R7303 Prediabetes: Secondary | ICD-10-CM | POA: Diagnosis not present

## 2022-11-05 DIAGNOSIS — Z125 Encounter for screening for malignant neoplasm of prostate: Secondary | ICD-10-CM | POA: Diagnosis not present

## 2022-11-05 DIAGNOSIS — E785 Hyperlipidemia, unspecified: Secondary | ICD-10-CM | POA: Diagnosis not present

## 2022-11-05 DIAGNOSIS — R030 Elevated blood-pressure reading, without diagnosis of hypertension: Secondary | ICD-10-CM | POA: Diagnosis not present

## 2022-11-05 DIAGNOSIS — R7989 Other specified abnormal findings of blood chemistry: Secondary | ICD-10-CM | POA: Diagnosis not present

## 2022-11-10 DIAGNOSIS — H938X3 Other specified disorders of ear, bilateral: Secondary | ICD-10-CM | POA: Diagnosis not present

## 2022-11-10 DIAGNOSIS — L309 Dermatitis, unspecified: Secondary | ICD-10-CM | POA: Diagnosis not present

## 2022-11-10 DIAGNOSIS — M545 Low back pain, unspecified: Secondary | ICD-10-CM | POA: Diagnosis not present

## 2022-11-10 DIAGNOSIS — R8281 Pyuria: Secondary | ICD-10-CM | POA: Diagnosis not present

## 2022-11-10 DIAGNOSIS — M23231 Derangement of other medial meniscus due to old tear or injury, right knee: Secondary | ICD-10-CM | POA: Diagnosis not present

## 2022-11-10 DIAGNOSIS — K219 Gastro-esophageal reflux disease without esophagitis: Secondary | ICD-10-CM | POA: Diagnosis not present

## 2022-11-10 DIAGNOSIS — R799 Abnormal finding of blood chemistry, unspecified: Secondary | ICD-10-CM | POA: Diagnosis not present

## 2022-11-10 DIAGNOSIS — E785 Hyperlipidemia, unspecified: Secondary | ICD-10-CM | POA: Diagnosis not present

## 2022-11-10 DIAGNOSIS — M542 Cervicalgia: Secondary | ICD-10-CM | POA: Diagnosis not present

## 2022-11-10 DIAGNOSIS — R030 Elevated blood-pressure reading, without diagnosis of hypertension: Secondary | ICD-10-CM | POA: Diagnosis not present

## 2022-11-10 DIAGNOSIS — Z1339 Encounter for screening examination for other mental health and behavioral disorders: Secondary | ICD-10-CM | POA: Diagnosis not present

## 2022-11-10 DIAGNOSIS — R7303 Prediabetes: Secondary | ICD-10-CM | POA: Diagnosis not present

## 2022-11-10 DIAGNOSIS — Z1331 Encounter for screening for depression: Secondary | ICD-10-CM | POA: Diagnosis not present

## 2022-11-10 DIAGNOSIS — M19042 Primary osteoarthritis, left hand: Secondary | ICD-10-CM | POA: Diagnosis not present

## 2022-11-10 DIAGNOSIS — Z0000 Encounter for general adult medical examination without abnormal findings: Secondary | ICD-10-CM | POA: Diagnosis not present

## 2022-11-10 DIAGNOSIS — D692 Other nonthrombocytopenic purpura: Secondary | ICD-10-CM | POA: Diagnosis not present

## 2022-11-11 ENCOUNTER — Other Ambulatory Visit: Payer: Self-pay | Admitting: Internal Medicine

## 2022-11-11 DIAGNOSIS — E785 Hyperlipidemia, unspecified: Secondary | ICD-10-CM

## 2022-11-23 ENCOUNTER — Ambulatory Visit
Admission: RE | Admit: 2022-11-23 | Discharge: 2022-11-23 | Disposition: A | Discharge status: Home / Self Care | Payer: Self-pay | Source: Ambulatory Visit | Attending: Internal Medicine | Admitting: Internal Medicine

## 2022-11-23 DIAGNOSIS — E785 Hyperlipidemia, unspecified: Secondary | ICD-10-CM

## 2022-12-08 DIAGNOSIS — M23231 Derangement of other medial meniscus due to old tear or injury, right knee: Secondary | ICD-10-CM | POA: Diagnosis not present

## 2022-12-08 DIAGNOSIS — M545 Low back pain, unspecified: Secondary | ICD-10-CM | POA: Diagnosis not present

## 2022-12-08 DIAGNOSIS — M542 Cervicalgia: Secondary | ICD-10-CM | POA: Diagnosis not present

## 2022-12-11 DIAGNOSIS — H26493 Other secondary cataract, bilateral: Secondary | ICD-10-CM | POA: Diagnosis not present

## 2022-12-11 DIAGNOSIS — H52203 Unspecified astigmatism, bilateral: Secondary | ICD-10-CM | POA: Diagnosis not present

## 2022-12-11 DIAGNOSIS — H524 Presbyopia: Secondary | ICD-10-CM | POA: Diagnosis not present

## 2022-12-11 DIAGNOSIS — H43813 Vitreous degeneration, bilateral: Secondary | ICD-10-CM | POA: Diagnosis not present

## 2022-12-11 DIAGNOSIS — H04123 Dry eye syndrome of bilateral lacrimal glands: Secondary | ICD-10-CM | POA: Diagnosis not present

## 2022-12-11 DIAGNOSIS — H5052 Exophoria: Secondary | ICD-10-CM | POA: Diagnosis not present

## 2022-12-11 DIAGNOSIS — Z961 Presence of intraocular lens: Secondary | ICD-10-CM | POA: Diagnosis not present

## 2022-12-29 DIAGNOSIS — H26491 Other secondary cataract, right eye: Secondary | ICD-10-CM | POA: Diagnosis not present

## 2023-01-15 DIAGNOSIS — M23231 Derangement of other medial meniscus due to old tear or injury, right knee: Secondary | ICD-10-CM | POA: Diagnosis not present

## 2023-01-15 DIAGNOSIS — M542 Cervicalgia: Secondary | ICD-10-CM | POA: Diagnosis not present

## 2023-01-15 DIAGNOSIS — M545 Low back pain, unspecified: Secondary | ICD-10-CM | POA: Diagnosis not present

## 2023-01-22 DIAGNOSIS — M23231 Derangement of other medial meniscus due to old tear or injury, right knee: Secondary | ICD-10-CM | POA: Diagnosis not present

## 2023-01-22 DIAGNOSIS — M545 Low back pain, unspecified: Secondary | ICD-10-CM | POA: Diagnosis not present

## 2023-01-22 DIAGNOSIS — M542 Cervicalgia: Secondary | ICD-10-CM | POA: Diagnosis not present

## 2023-01-29 DIAGNOSIS — M545 Low back pain, unspecified: Secondary | ICD-10-CM | POA: Diagnosis not present

## 2023-01-29 DIAGNOSIS — M23231 Derangement of other medial meniscus due to old tear or injury, right knee: Secondary | ICD-10-CM | POA: Diagnosis not present

## 2023-01-29 DIAGNOSIS — M542 Cervicalgia: Secondary | ICD-10-CM | POA: Diagnosis not present

## 2023-03-03 DIAGNOSIS — M545 Low back pain, unspecified: Secondary | ICD-10-CM | POA: Diagnosis not present

## 2023-03-03 DIAGNOSIS — M542 Cervicalgia: Secondary | ICD-10-CM | POA: Diagnosis not present

## 2023-03-03 DIAGNOSIS — M23231 Derangement of other medial meniscus due to old tear or injury, right knee: Secondary | ICD-10-CM | POA: Diagnosis not present

## 2023-03-31 DIAGNOSIS — M545 Low back pain, unspecified: Secondary | ICD-10-CM | POA: Diagnosis not present

## 2023-03-31 DIAGNOSIS — M23231 Derangement of other medial meniscus due to old tear or injury, right knee: Secondary | ICD-10-CM | POA: Diagnosis not present

## 2023-03-31 DIAGNOSIS — M25511 Pain in right shoulder: Secondary | ICD-10-CM | POA: Diagnosis not present

## 2023-03-31 DIAGNOSIS — M542 Cervicalgia: Secondary | ICD-10-CM | POA: Diagnosis not present

## 2023-04-20 DIAGNOSIS — M23231 Derangement of other medial meniscus due to old tear or injury, right knee: Secondary | ICD-10-CM | POA: Diagnosis not present

## 2023-04-20 DIAGNOSIS — M542 Cervicalgia: Secondary | ICD-10-CM | POA: Diagnosis not present

## 2023-04-20 DIAGNOSIS — M545 Low back pain, unspecified: Secondary | ICD-10-CM | POA: Diagnosis not present

## 2023-04-20 DIAGNOSIS — M25511 Pain in right shoulder: Secondary | ICD-10-CM | POA: Diagnosis not present

## 2023-05-07 DIAGNOSIS — E785 Hyperlipidemia, unspecified: Secondary | ICD-10-CM | POA: Diagnosis not present

## 2023-05-12 DIAGNOSIS — K219 Gastro-esophageal reflux disease without esophagitis: Secondary | ICD-10-CM | POA: Diagnosis not present

## 2023-05-12 DIAGNOSIS — R7303 Prediabetes: Secondary | ICD-10-CM | POA: Diagnosis not present

## 2023-05-12 DIAGNOSIS — E785 Hyperlipidemia, unspecified: Secondary | ICD-10-CM | POA: Diagnosis not present

## 2023-05-12 DIAGNOSIS — H938X3 Other specified disorders of ear, bilateral: Secondary | ICD-10-CM | POA: Diagnosis not present

## 2023-05-12 DIAGNOSIS — L309 Dermatitis, unspecified: Secondary | ICD-10-CM | POA: Diagnosis not present

## 2023-05-12 DIAGNOSIS — Z23 Encounter for immunization: Secondary | ICD-10-CM | POA: Diagnosis not present

## 2023-05-12 DIAGNOSIS — M7050 Other bursitis of knee, unspecified knee: Secondary | ICD-10-CM | POA: Diagnosis not present

## 2023-05-12 DIAGNOSIS — R030 Elevated blood-pressure reading, without diagnosis of hypertension: Secondary | ICD-10-CM | POA: Diagnosis not present

## 2023-05-12 DIAGNOSIS — D692 Other nonthrombocytopenic purpura: Secondary | ICD-10-CM | POA: Diagnosis not present

## 2023-05-12 DIAGNOSIS — R799 Abnormal finding of blood chemistry, unspecified: Secondary | ICD-10-CM | POA: Diagnosis not present

## 2023-05-12 DIAGNOSIS — M19042 Primary osteoarthritis, left hand: Secondary | ICD-10-CM | POA: Diagnosis not present

## 2023-05-13 DIAGNOSIS — M545 Low back pain, unspecified: Secondary | ICD-10-CM | POA: Diagnosis not present

## 2023-05-13 DIAGNOSIS — M23231 Derangement of other medial meniscus due to old tear or injury, right knee: Secondary | ICD-10-CM | POA: Diagnosis not present

## 2023-05-13 DIAGNOSIS — M542 Cervicalgia: Secondary | ICD-10-CM | POA: Diagnosis not present

## 2023-05-13 DIAGNOSIS — M25511 Pain in right shoulder: Secondary | ICD-10-CM | POA: Diagnosis not present

## 2023-06-10 DIAGNOSIS — M25511 Pain in right shoulder: Secondary | ICD-10-CM | POA: Diagnosis not present

## 2023-06-10 DIAGNOSIS — M545 Low back pain, unspecified: Secondary | ICD-10-CM | POA: Diagnosis not present

## 2023-06-10 DIAGNOSIS — M23231 Derangement of other medial meniscus due to old tear or injury, right knee: Secondary | ICD-10-CM | POA: Diagnosis not present

## 2023-06-10 DIAGNOSIS — M542 Cervicalgia: Secondary | ICD-10-CM | POA: Diagnosis not present

## 2023-07-09 DIAGNOSIS — M545 Low back pain, unspecified: Secondary | ICD-10-CM | POA: Diagnosis not present

## 2023-07-09 DIAGNOSIS — M23231 Derangement of other medial meniscus due to old tear or injury, right knee: Secondary | ICD-10-CM | POA: Diagnosis not present

## 2023-07-09 DIAGNOSIS — M542 Cervicalgia: Secondary | ICD-10-CM | POA: Diagnosis not present

## 2023-07-09 DIAGNOSIS — M25511 Pain in right shoulder: Secondary | ICD-10-CM | POA: Diagnosis not present

## 2023-07-14 DIAGNOSIS — Z23 Encounter for immunization: Secondary | ICD-10-CM | POA: Diagnosis not present

## 2023-07-26 DIAGNOSIS — Z23 Encounter for immunization: Secondary | ICD-10-CM | POA: Diagnosis not present

## 2023-07-26 DIAGNOSIS — S61215A Laceration without foreign body of left ring finger without damage to nail, initial encounter: Secondary | ICD-10-CM | POA: Diagnosis not present

## 2023-07-26 DIAGNOSIS — W312XXA Contact with powered woodworking and forming machines, initial encounter: Secondary | ICD-10-CM | POA: Diagnosis not present

## 2023-08-02 DIAGNOSIS — S61315A Laceration without foreign body of left ring finger with damage to nail, initial encounter: Secondary | ICD-10-CM | POA: Diagnosis not present

## 2023-08-05 DIAGNOSIS — R053 Chronic cough: Secondary | ICD-10-CM | POA: Diagnosis not present

## 2023-08-05 DIAGNOSIS — R058 Other specified cough: Secondary | ICD-10-CM | POA: Diagnosis not present

## 2023-08-06 DIAGNOSIS — M542 Cervicalgia: Secondary | ICD-10-CM | POA: Diagnosis not present

## 2023-08-06 DIAGNOSIS — M25511 Pain in right shoulder: Secondary | ICD-10-CM | POA: Diagnosis not present

## 2023-08-06 DIAGNOSIS — M545 Low back pain, unspecified: Secondary | ICD-10-CM | POA: Diagnosis not present

## 2023-08-06 DIAGNOSIS — M23231 Derangement of other medial meniscus due to old tear or injury, right knee: Secondary | ICD-10-CM | POA: Diagnosis not present

## 2023-09-03 DIAGNOSIS — M545 Low back pain, unspecified: Secondary | ICD-10-CM | POA: Diagnosis not present

## 2023-09-03 DIAGNOSIS — M542 Cervicalgia: Secondary | ICD-10-CM | POA: Diagnosis not present

## 2023-09-03 DIAGNOSIS — M25511 Pain in right shoulder: Secondary | ICD-10-CM | POA: Diagnosis not present

## 2023-09-07 DIAGNOSIS — S299XXA Unspecified injury of thorax, initial encounter: Secondary | ICD-10-CM | POA: Diagnosis not present

## 2023-09-07 DIAGNOSIS — W01198A Fall on same level from slipping, tripping and stumbling with subsequent striking against other object, initial encounter: Secondary | ICD-10-CM | POA: Diagnosis not present

## 2023-09-29 DIAGNOSIS — M542 Cervicalgia: Secondary | ICD-10-CM | POA: Diagnosis not present

## 2023-09-29 DIAGNOSIS — M545 Low back pain, unspecified: Secondary | ICD-10-CM | POA: Diagnosis not present

## 2023-09-29 DIAGNOSIS — M25511 Pain in right shoulder: Secondary | ICD-10-CM | POA: Diagnosis not present

## 2023-10-27 DIAGNOSIS — M542 Cervicalgia: Secondary | ICD-10-CM | POA: Diagnosis not present

## 2023-10-27 DIAGNOSIS — M25511 Pain in right shoulder: Secondary | ICD-10-CM | POA: Diagnosis not present

## 2023-10-27 DIAGNOSIS — M545 Low back pain, unspecified: Secondary | ICD-10-CM | POA: Diagnosis not present

## 2023-11-10 DIAGNOSIS — R030 Elevated blood-pressure reading, without diagnosis of hypertension: Secondary | ICD-10-CM | POA: Diagnosis not present

## 2023-11-10 DIAGNOSIS — E785 Hyperlipidemia, unspecified: Secondary | ICD-10-CM | POA: Diagnosis not present

## 2023-11-10 DIAGNOSIS — R7303 Prediabetes: Secondary | ICD-10-CM | POA: Diagnosis not present

## 2023-11-10 DIAGNOSIS — Z125 Encounter for screening for malignant neoplasm of prostate: Secondary | ICD-10-CM | POA: Diagnosis not present

## 2023-11-16 DIAGNOSIS — L309 Dermatitis, unspecified: Secondary | ICD-10-CM | POA: Diagnosis not present

## 2023-11-16 DIAGNOSIS — M79674 Pain in right toe(s): Secondary | ICD-10-CM | POA: Diagnosis not present

## 2023-11-16 DIAGNOSIS — Z0000 Encounter for general adult medical examination without abnormal findings: Secondary | ICD-10-CM | POA: Diagnosis not present

## 2023-11-16 DIAGNOSIS — R82998 Other abnormal findings in urine: Secondary | ICD-10-CM | POA: Diagnosis not present

## 2023-11-16 DIAGNOSIS — R030 Elevated blood-pressure reading, without diagnosis of hypertension: Secondary | ICD-10-CM | POA: Diagnosis not present

## 2023-11-16 DIAGNOSIS — R7303 Prediabetes: Secondary | ICD-10-CM | POA: Diagnosis not present

## 2023-11-16 DIAGNOSIS — Z1331 Encounter for screening for depression: Secondary | ICD-10-CM | POA: Diagnosis not present

## 2023-11-16 DIAGNOSIS — E785 Hyperlipidemia, unspecified: Secondary | ICD-10-CM | POA: Diagnosis not present

## 2023-11-16 DIAGNOSIS — Z1339 Encounter for screening examination for other mental health and behavioral disorders: Secondary | ICD-10-CM | POA: Diagnosis not present

## 2023-11-16 DIAGNOSIS — M19042 Primary osteoarthritis, left hand: Secondary | ICD-10-CM | POA: Diagnosis not present

## 2023-11-16 DIAGNOSIS — B351 Tinea unguium: Secondary | ICD-10-CM | POA: Diagnosis not present

## 2023-11-16 DIAGNOSIS — R799 Abnormal finding of blood chemistry, unspecified: Secondary | ICD-10-CM | POA: Diagnosis not present

## 2023-11-16 DIAGNOSIS — K219 Gastro-esophageal reflux disease without esophagitis: Secondary | ICD-10-CM | POA: Diagnosis not present

## 2023-12-06 ENCOUNTER — Ambulatory Visit (INDEPENDENT_AMBULATORY_CARE_PROVIDER_SITE_OTHER)

## 2023-12-06 ENCOUNTER — Ambulatory Visit (INDEPENDENT_AMBULATORY_CARE_PROVIDER_SITE_OTHER): Admitting: Podiatry

## 2023-12-06 ENCOUNTER — Encounter: Payer: Self-pay | Admitting: Podiatry

## 2023-12-06 VITALS — Ht 71.0 in | Wt 170.0 lb

## 2023-12-06 DIAGNOSIS — Q6689 Other  specified congenital deformities of feet: Secondary | ICD-10-CM | POA: Diagnosis not present

## 2023-12-06 DIAGNOSIS — B351 Tinea unguium: Secondary | ICD-10-CM | POA: Diagnosis not present

## 2023-12-06 DIAGNOSIS — M205X1 Other deformities of toe(s) (acquired), right foot: Secondary | ICD-10-CM

## 2023-12-06 NOTE — Progress Notes (Signed)
 Subjective:   Patient ID: Henry Tate, male   DOB: 73 y.o.   MRN: [id number]   HPI Patient presents stating that the big toe is rubbing against the second toe right and I think it is moved over and I am concerned about the structure and I also have some discoloration about big toenails.  Patient does not smoke likes to be active   Review of Systems  All other systems reviewed and are negative.       Objective:  Physical Exam Vitals and nursing note reviewed.  Constitutional:      Appearance: He is well-developed.  Pulmonary:     Effort: Pulmonary effort is normal.  Musculoskeletal:        General: Normal range of motion.  Skin:    General: Skin is warm.  Neurological:     Mental Status: He is alert.     Neurovascular status intact muscle strength adequate range of motion adequate patient is noted to have deviation right second toe against the big toe mild irritation between the toes and distal discoloration of the nailbed bilateral the distal one third of the bed no pain bilateral.  Good digital perfusion well-oriented     Assessment:  Digital deformity second digit right with slight medial dislocation with the probability of nail trauma with a low-grade fungal infiltration that does not appear to be systemic     Plan:  H&P x-ray right reviewed discussed padding as needed and keeping the toes dry and discussed nail condition do not recommend current treatment may be something of eventually that we have to address but at this point I am considering this more trauma than fungus  X-rays indicate medial deviation of the second digit at the MPJ with possibility for slight flexor plate dislocation

## 2023-12-08 DIAGNOSIS — M25511 Pain in right shoulder: Secondary | ICD-10-CM | POA: Diagnosis not present

## 2023-12-08 DIAGNOSIS — M545 Low back pain, unspecified: Secondary | ICD-10-CM | POA: Diagnosis not present

## 2023-12-08 DIAGNOSIS — M542 Cervicalgia: Secondary | ICD-10-CM | POA: Diagnosis not present

## 2024-01-05 DIAGNOSIS — M545 Low back pain, unspecified: Secondary | ICD-10-CM | POA: Diagnosis not present

## 2024-01-05 DIAGNOSIS — M542 Cervicalgia: Secondary | ICD-10-CM | POA: Diagnosis not present

## 2024-01-05 DIAGNOSIS — M25511 Pain in right shoulder: Secondary | ICD-10-CM | POA: Diagnosis not present

## 2024-01-17 DIAGNOSIS — M25511 Pain in right shoulder: Secondary | ICD-10-CM | POA: Diagnosis not present

## 2024-01-17 DIAGNOSIS — M545 Low back pain, unspecified: Secondary | ICD-10-CM | POA: Diagnosis not present

## 2024-01-17 DIAGNOSIS — M542 Cervicalgia: Secondary | ICD-10-CM | POA: Diagnosis not present

## 2024-01-19 DIAGNOSIS — H52203 Unspecified astigmatism, bilateral: Secondary | ICD-10-CM | POA: Diagnosis not present

## 2024-01-19 DIAGNOSIS — H43813 Vitreous degeneration, bilateral: Secondary | ICD-10-CM | POA: Diagnosis not present

## 2024-01-19 DIAGNOSIS — D23111 Other benign neoplasm of skin of right upper eyelid, including canthus: Secondary | ICD-10-CM | POA: Diagnosis not present

## 2024-01-19 DIAGNOSIS — H0100A Unspecified blepharitis right eye, upper and lower eyelids: Secondary | ICD-10-CM | POA: Diagnosis not present

## 2024-01-19 DIAGNOSIS — H524 Presbyopia: Secondary | ICD-10-CM | POA: Diagnosis not present

## 2024-01-19 DIAGNOSIS — H26492 Other secondary cataract, left eye: Secondary | ICD-10-CM | POA: Diagnosis not present

## 2024-01-19 DIAGNOSIS — H01004 Unspecified blepharitis left upper eyelid: Secondary | ICD-10-CM | POA: Diagnosis not present

## 2024-01-31 DIAGNOSIS — M545 Low back pain, unspecified: Secondary | ICD-10-CM | POA: Diagnosis not present

## 2024-01-31 DIAGNOSIS — M542 Cervicalgia: Secondary | ICD-10-CM | POA: Diagnosis not present

## 2024-01-31 DIAGNOSIS — M25511 Pain in right shoulder: Secondary | ICD-10-CM | POA: Diagnosis not present

## 2024-02-21 DIAGNOSIS — M545 Low back pain, unspecified: Secondary | ICD-10-CM | POA: Diagnosis not present

## 2024-02-21 DIAGNOSIS — M542 Cervicalgia: Secondary | ICD-10-CM | POA: Diagnosis not present

## 2024-02-21 DIAGNOSIS — M25511 Pain in right shoulder: Secondary | ICD-10-CM | POA: Diagnosis not present

## 2024-03-13 DIAGNOSIS — M25511 Pain in right shoulder: Secondary | ICD-10-CM | POA: Diagnosis not present

## 2024-03-13 DIAGNOSIS — M545 Low back pain, unspecified: Secondary | ICD-10-CM | POA: Diagnosis not present

## 2024-03-13 DIAGNOSIS — M542 Cervicalgia: Secondary | ICD-10-CM | POA: Diagnosis not present

## 2024-04-04 DIAGNOSIS — M542 Cervicalgia: Secondary | ICD-10-CM | POA: Diagnosis not present

## 2024-04-04 DIAGNOSIS — M25511 Pain in right shoulder: Secondary | ICD-10-CM | POA: Diagnosis not present

## 2024-04-04 DIAGNOSIS — M545 Low back pain, unspecified: Secondary | ICD-10-CM | POA: Diagnosis not present

## 2024-04-13 DIAGNOSIS — Z23 Encounter for immunization: Secondary | ICD-10-CM | POA: Diagnosis not present

## 2024-04-25 DIAGNOSIS — M545 Low back pain, unspecified: Secondary | ICD-10-CM | POA: Diagnosis not present

## 2024-04-25 DIAGNOSIS — M542 Cervicalgia: Secondary | ICD-10-CM | POA: Diagnosis not present

## 2024-04-25 DIAGNOSIS — M25511 Pain in right shoulder: Secondary | ICD-10-CM | POA: Diagnosis not present

## 2024-05-16 DIAGNOSIS — E785 Hyperlipidemia, unspecified: Secondary | ICD-10-CM | POA: Diagnosis not present

## 2024-05-16 DIAGNOSIS — R0981 Nasal congestion: Secondary | ICD-10-CM | POA: Diagnosis not present

## 2024-05-16 DIAGNOSIS — B351 Tinea unguium: Secondary | ICD-10-CM | POA: Diagnosis not present

## 2024-05-16 DIAGNOSIS — N1831 Chronic kidney disease, stage 3a: Secondary | ICD-10-CM | POA: Diagnosis not present

## 2024-05-16 DIAGNOSIS — R799 Abnormal finding of blood chemistry, unspecified: Secondary | ICD-10-CM | POA: Diagnosis not present

## 2024-05-16 DIAGNOSIS — M79674 Pain in right toe(s): Secondary | ICD-10-CM | POA: Diagnosis not present

## 2024-05-16 DIAGNOSIS — R7303 Prediabetes: Secondary | ICD-10-CM | POA: Diagnosis not present

## 2024-05-16 DIAGNOSIS — M19042 Primary osteoarthritis, left hand: Secondary | ICD-10-CM | POA: Diagnosis not present

## 2024-05-16 DIAGNOSIS — R030 Elevated blood-pressure reading, without diagnosis of hypertension: Secondary | ICD-10-CM | POA: Diagnosis not present

## 2024-05-16 DIAGNOSIS — L309 Dermatitis, unspecified: Secondary | ICD-10-CM | POA: Diagnosis not present

## 2024-05-16 DIAGNOSIS — K219 Gastro-esophageal reflux disease without esophagitis: Secondary | ICD-10-CM | POA: Diagnosis not present

## 2024-05-26 DIAGNOSIS — M25511 Pain in right shoulder: Secondary | ICD-10-CM | POA: Diagnosis not present

## 2024-05-26 DIAGNOSIS — M545 Low back pain, unspecified: Secondary | ICD-10-CM | POA: Diagnosis not present

## 2024-05-26 DIAGNOSIS — M542 Cervicalgia: Secondary | ICD-10-CM | POA: Diagnosis not present

## 2024-06-15 DIAGNOSIS — M545 Low back pain, unspecified: Secondary | ICD-10-CM | POA: Diagnosis not present

## 2024-06-15 DIAGNOSIS — M542 Cervicalgia: Secondary | ICD-10-CM | POA: Diagnosis not present

## 2024-06-15 DIAGNOSIS — M25511 Pain in right shoulder: Secondary | ICD-10-CM | POA: Diagnosis not present

## 2024-07-05 ENCOUNTER — Ambulatory Visit: Admitting: Podiatry

## 2024-07-05 ENCOUNTER — Ambulatory Visit (INDEPENDENT_AMBULATORY_CARE_PROVIDER_SITE_OTHER)

## 2024-07-05 ENCOUNTER — Encounter: Payer: Self-pay | Admitting: Podiatry

## 2024-07-05 DIAGNOSIS — M7752 Other enthesopathy of left foot: Secondary | ICD-10-CM

## 2024-07-05 DIAGNOSIS — M778 Other enthesopathies, not elsewhere classified: Secondary | ICD-10-CM

## 2024-07-05 MED ORDER — TRIAMCINOLONE ACETONIDE 10 MG/ML IJ SUSP
10.0000 mg | Freq: Once | INTRAMUSCULAR | Status: AC
  Administered 2024-07-05: 10 mg via INTRA_ARTICULAR

## 2024-07-05 NOTE — Progress Notes (Signed)
 Subjective:   Patient ID: Henry Tate, male   DOB: 72 y.o.   MRN: [id number]   HPI Patient states has been having problems with the bottom of his left foot for the last number of months and he has tried ice without improvement and states it hurts with ambulation and after periods of activity   ROS      Objective:  Physical Exam  Neurovascular status intact with inflammation mostly around the second MPJ right fluid buildup around the joint surface     Assessment:   inflammatory capsulitis second MPJ left with fluid buildup probable trauma     Plan:  H&P x-ray reviewed sterile block of the forefoot left sterile prep of the area aspirated the joint came out a small amount of clear fluid injected with quarter cc dexamethasone Kenalog  and advised on rigid bottom shoes and reappoint to recheck as needed.  X-rays were negative for signs of fracture in the area possible elongation of the second metatarsal segment
